# Patient Record
Sex: Male | Born: 1985 | Race: White | Hispanic: No | Marital: Married | Smoking: Former smoker
Health system: Southern US, Community
[De-identification: ages and names within clinical notes are randomized; demographics above are authoritative.]

## PROBLEM LIST (undated history)

## (undated) DIAGNOSIS — K5090 Crohn's disease, unspecified, without complications: Secondary | ICD-10-CM

## (undated) DIAGNOSIS — Z8619 Personal history of other infectious and parasitic diseases: Secondary | ICD-10-CM

## (undated) DIAGNOSIS — T7840XA Allergy, unspecified, initial encounter: Secondary | ICD-10-CM

## (undated) HISTORY — DX: Crohn's disease, unspecified, without complications: K50.90

## (undated) HISTORY — DX: Allergy, unspecified, initial encounter: T78.40XA

## (undated) HISTORY — DX: Personal history of other infectious and parasitic diseases: Z86.19

---

## 2016-02-03 HISTORY — PX: BALLOON SINUPLASTY: SHX5740

## 2017-07-08 ENCOUNTER — Telehealth: Payer: Self-pay | Admitting: Gastroenterology

## 2017-07-08 ENCOUNTER — Other Ambulatory Visit: Payer: Self-pay

## 2017-07-08 DIAGNOSIS — K200 Eosinophilic esophagitis: Secondary | ICD-10-CM

## 2017-07-09 NOTE — Telephone Encounter (Signed)
Spoke with Christy at [facility] and they need a letter of continuation for his Remicade.  I sent a request to Dr Gupta.   

## 2017-07-12 ENCOUNTER — Telehealth: Payer: Self-pay

## 2017-07-12 DIAGNOSIS — K5090 Crohn's disease, unspecified, without complications: Secondary | ICD-10-CM

## 2017-07-12 NOTE — Telephone Encounter (Signed)
Spoke with Neysa Bonito at [facility] and they need a letter of continuation for his Remicade.  I sent a request to Dr Chales Abrahams.

## 2017-07-12 NOTE — Telephone Encounter (Signed)
Thanks Same dose 10mg /kg q 8 weeks

## 2017-07-13 ENCOUNTER — Telehealth: Payer: Self-pay

## 2017-07-13 MED ORDER — INFLIXIMAB 100 MG IV SOLR: INTRAVENOUS | 6 refills | Status: DC

## 2017-07-13 MED ORDER — INFLIXIMAB 100 MG IV SOLR: INTRAVENOUS | 6 refills | Status: AC

## 2017-07-13 NOTE — Telephone Encounter (Signed)
Prescription for continuation of Remicade faxed to [facility].

## 2017-07-13 NOTE — Addendum Note (Signed)
Addended by: Arvella Merles on: 07/13/2017 11:44 AM   Modules accepted: Orders

## 2017-07-13 NOTE — Addendum Note (Signed)
Addended by: Annett Fabian on: 07/13/2017 11:48 AM   Modules accepted: Orders

## 2017-07-13 NOTE — Addendum Note (Signed)
Addended by: Arvella Merles on: 07/13/2017 11:45 AM   Modules accepted: Orders

## 2017-10-13 ENCOUNTER — Telehealth: Payer: Self-pay | Admitting: Gastroenterology

## 2017-10-13 NOTE — Telephone Encounter (Signed)
Looks like he will need to be seen fairly quickly Any opening in App clinic? Or my clinic? Pl order CBC, CMP, sed rate and CRP Stool studies - GI pathogen including C. Diff, WBC, fecal calprotectin PRIOR.  So, we can have results at visit. May need CT (if any abdominal tenderness) and/or steroids - to be determined at clinic visit

## 2017-10-13 NOTE — Telephone Encounter (Signed)
Pt states he thinks he is having a crohn's flare. Reports he has been having diarrhea stools 5-6 times a day. This started 3 days ago. Reports this morning he felt like he had a temp and now he is aching. He is on Remicade. Pt wants to know what to do. Please advise.

## 2017-10-13 NOTE — Telephone Encounter (Signed)
Pt is having chrons flare and wants to know what he can take. Pls call him.

## 2017-10-14 ENCOUNTER — Other Ambulatory Visit: Payer: Self-pay

## 2017-10-14 ENCOUNTER — Encounter: Payer: Self-pay | Admitting: Gastroenterology

## 2017-10-14 ENCOUNTER — Other Ambulatory Visit (INDEPENDENT_AMBULATORY_CARE_PROVIDER_SITE_OTHER): Payer: PRIVATE HEALTH INSURANCE

## 2017-10-14 DIAGNOSIS — K5010 Crohn's disease of large intestine without complications: Secondary | ICD-10-CM

## 2017-10-14 DIAGNOSIS — K5090 Crohn's disease, unspecified, without complications: Secondary | ICD-10-CM

## 2017-10-14 NOTE — Telephone Encounter (Signed)
Pt to come for labs today, orders in epic. Pt scheduled to see Dr. Chales Abrahams 10/15/17@8 :45am. Pt aware of appt. Dr. Chales Abrahams aware.

## 2017-10-15 ENCOUNTER — Other Ambulatory Visit (INDEPENDENT_AMBULATORY_CARE_PROVIDER_SITE_OTHER): Payer: PRIVATE HEALTH INSURANCE

## 2017-10-15 ENCOUNTER — Ambulatory Visit (INDEPENDENT_AMBULATORY_CARE_PROVIDER_SITE_OTHER): Payer: PRIVATE HEALTH INSURANCE | Admitting: Gastroenterology

## 2017-10-15 ENCOUNTER — Encounter: Payer: Self-pay | Admitting: Gastroenterology

## 2017-10-15 VITALS — BP 124/84 | HR 88 | Ht 66.0 in | Wt 169.0 lb

## 2017-10-15 DIAGNOSIS — K5010 Crohn's disease of large intestine without complications: Secondary | ICD-10-CM | POA: Diagnosis not present

## 2017-10-15 DIAGNOSIS — R1031 Right lower quadrant pain: Secondary | ICD-10-CM

## 2017-10-15 DIAGNOSIS — K5090 Crohn's disease, unspecified, without complications: Secondary | ICD-10-CM | POA: Diagnosis not present

## 2017-10-15 LAB — CBC WITH DIFFERENTIAL/PLATELET
Basophils Absolute: 0.1 10*3/uL (ref 0.0–0.1)
Basophils Relative: 0.9 % (ref 0.0–3.0)
EOS ABS: 0.1 10*3/uL (ref 0.0–0.7)
EOS PCT: 1.3 % (ref 0.0–5.0)
HCT: 40.5 % (ref 39.0–52.0)
Hemoglobin: 13.8 g/dL (ref 13.0–17.0)
LYMPHS ABS: 1.3 10*3/uL (ref 0.7–4.0)
Lymphocytes Relative: 20 % (ref 12.0–46.0)
MCHC: 34.2 g/dL (ref 30.0–36.0)
MCV: 86.1 fl (ref 78.0–100.0)
MONO ABS: 1 10*3/uL (ref 0.1–1.0)
Monocytes Relative: 15.2 % — ABNORMAL HIGH (ref 3.0–12.0)
NEUTROS PCT: 62.6 % (ref 43.0–77.0)
Neutro Abs: 4.2 10*3/uL (ref 1.4–7.7)
Platelets: 200 10*3/uL (ref 150.0–400.0)
RBC: 4.7 Mil/uL (ref 4.22–5.81)
RDW: 12.8 % (ref 11.5–15.5)
WBC: 6.7 10*3/uL (ref 4.0–10.5)

## 2017-10-15 LAB — COMPREHENSIVE METABOLIC PANEL
ALT: 18 U/L (ref 0–53)
AST: 19 U/L (ref 0–37)
Albumin: 4.1 g/dL (ref 3.5–5.2)
Alkaline Phosphatase: 79 U/L (ref 39–117)
BUN: 12 mg/dL (ref 6–23)
CHLORIDE: 101 meq/L (ref 96–112)
CO2: 27 mEq/L (ref 19–32)
Calcium: 8.9 mg/dL (ref 8.4–10.5)
Creatinine, Ser: 0.89 mg/dL (ref 0.40–1.50)
GFR: 106.28 mL/min (ref >60.00)
GLUCOSE: 102 mg/dL — AB (ref 70–99)
Potassium: 4 mEq/L (ref 3.5–5.1)
Sodium: 138 mEq/L (ref 135–145)
Total Bilirubin: 0.4 mg/dL (ref 0.2–1.2)
Total Protein: 7.3 g/dL (ref 6.0–8.3)

## 2017-10-15 LAB — SEDIMENTATION RATE: Sed Rate: 69 mm/hr — ABNORMAL HIGH (ref 0–15)

## 2017-10-15 LAB — HIGH SENSITIVITY CRP: CRP, High Sensitivity: 87.71 mg/L — ABNORMAL HIGH (ref 0.000–5.000)

## 2017-10-15 MED ORDER — PREDNISONE 10 MG PO TABS: ORAL_TABLET | ORAL | 0 refills | Status: DC

## 2017-10-15 NOTE — Patient Instructions (Signed)
If you are age 30 or older, your body mass index should be between 23-30. Your Body mass index is 27.28 kg/m. If this is out of the aforementioned range listed, please consider follow up with your Primary Care Provider.  If you are age 48 or younger, your body mass index should be between 19-25. Your Body mass index is 27.28 kg/m. If this is out of the aformentioned range listed, please consider follow up with your Primary Care Provider.   We have sent the following medications to your pharmacy for you to pick up at your convenience: We have sent the following medications to your pharmacy for you to pick up at your convenience: Prednisone 80m tablet 490mby mouth x 1 week. 3067my mouth x 1 week. 21m47m mouth x 2 weeks. 10mg16mmouth x 2 weeks.  You [facility] 8[facility]. [place] arrive 15 minutes prior to your appointment time for registration. Please follow the written instructions below on the day of your exam:  WARNING: IF YOU ARE ALLERGIC TO IODINE/X-RAY DYE, PLEASE NOTIFY RADIOLOGY IMMEDIATELY AT [phone] WILL BE GIVEN A 13 HOUR PREMEDICATION PREP.  1) Do not eat or drink anything after 4am (4 hours prior to your test) 2) You have been given 2 bottles of oral contrast to drink. The solution may taste better if refrigerated, but do NOT add ice or any other liquid to this solution. Shake well before drinking.    Drink 1 bottle of contrast @ 6am (2 hours prior to your exam)  Drink 1 bottle of contrast @ 7am (1 hour prior to your exam)  You may take any medications as prescribed with a small amount of water except for the following: Metformin, Glucophage, Glucovance, Avandamet, Riomet, Fortamet, Actoplus Met, Janumet, Glumetza or Metaglip. The above medications must be held the day of the exam AND 48 hours after the exam.  The purpose of you drinking the oral contrast is  to aid in the visualization of your intestinal tract. The contrast solution may cause some diarrhea. Before your exam is started, you will be given a small amount of fluid to drink. Depending on your individual set of symptoms, you may also receive an intravenous injection of x-ray contrast/dye. [facility] minutes or longer, depending on the type of exam you are having performed.  This test typically takes 30-45 minutes to complete.  If you have any questions regarding your exam or if you need to reschedule, you may call the CT department at [phone] the hours of 8:00 am and 5:00 pm, Monday-Friday.  ________________________________________________________________________   You wDennis Bast be due for a recall colonoscopy in 02/2018. We will send you a reminder in the mail when it gets closer to that time.  Thank you,  Dr. RajesJackquline Denmark

## 2017-10-15 NOTE — Progress Notes (Signed)
Chief Complaint: Diarrhea  Referring Provider:  No ref. provider found      ASSESSMENT AND PLAN;   #1. Crohns disease: Dx at age 32 (1999)-ileocolonic disease.  Dx on UGI series and colonoscopy 5/99.  Treated with steroids, Asacol and Imuran until 2002 when switched to Remicade 10 mg/kg, has been on maintenance Remicade ever since. Seen by Dr Steele Sizer at [facility]. 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  Normal colon. Had exacerbation 2018, treated with short course of steroids with good results.   Plan: - CTE scan of abdomen and pelvis with p.o. and IV contrast. - Start prednisone 40 mg p.o. once a day for 1 week, 30 mg p.o. once a day for 1 week, followed by 20 mg p.o. once a day for 2 weeks, then 10 mg p.o. once a day for 2 weeks and then stop. I have extensively discussed the side effects of prednisone including weight gain, diabetes, osteoporosis, cataracts, glaucoma and psychiatric manifestations. - Follow labs and stool tests done yesterday and this morning. - May need antibiotics depending upon the above results. - Last dose of Remicade was August 27 2017, next dose due October 28, 2017.  We will make decision depending upon the above results. - Recommend repeating colonoscopy Jan 2020. (Patient going to [place] with his family in December).   #2.  Infliximab long-term use (neg HbsAg, TB gold, normal TPMT 12/2015) #3.  Health maintenance -Should get flu shot in November -Increase calcium intake -Should get bone density scan. Should have PCP. -Had shingles in the past.  #4.  Associated lactose intolerance.  HPI:    Gabriel Guzman is a 32 y.o. male  Seen as an emergency work in [place] due to diarrhea, mild abdominal pain-we have ordered CBC, CMP, C-reactive protein and stool testing which are all pending at the present time. X 4-5 days Diarrhea 5-6/day, some nocturnal symptoms Mild abdominal discomfort but no definite pain. No recent antibiotics Has  subjective arthralgias but no arthritis No skin rash Last remicade7/26,  Due 9/27 No significant fever or chills. No nausea vomiting melena or hematochezia.   Past Medical History:  Diagnosis Date  . Crohn's disease (HCC)   . History of shingles     Past Surgical History:  Procedure Laterality Date  . BALLOON SINUPLASTY  2018    Family History  Problem Relation Age of Onset  . Ulcerative colitis Mother   . Colon cancer Neg Hx   . Esophageal cancer Neg Hx   . Prostate cancer Neg Hx   . Breast cancer Neg Hx     Social History  Has 29-year-old and 65-year-old(daughter) -going to [facility] December 2019 Tobacco Use  . Smoking status: Former Smoker    Types: Cigarettes  . Smokeless tobacco: Former Neurosurgeon  . Tobacco comment: quit years ago  Substance Use Topics  . Alcohol use: Yes    Comment: Ocassionally  . Drug use: Never    Current Outpatient Medications  Medication Sig Dispense Refill  . cetirizine (ZYRTEC) 10 MG tablet Take 10 mg by mouth daily.    . Chlorophyll (CHLOROXYGEN PO) Take 1 tablet by mouth daily.    . fluticasone (FLONASE) 50 MCG/ACT nasal spray Place 1 spray into both nostrils as needed for allergies or rhinitis.    Marland Kitchen inFLIXimab (REMICADE) 100 MG injection Please infuse Remicade 10 mg/kg q 8 weeks 1 each 6  . Multiple Vitamin (MULTIVITAMIN) tablet Take 1 tablet by mouth daily.    . Omega-3  Fatty Acids (FISH OIL) 1000 MG CAPS Take 2 capsules by mouth daily.    . Probiotic Product (PROBIOTIC-10 PO) Take 1 tablet by mouth daily.     No current facility-administered medications for this visit.     Not on File  Review of Systems:  Constitutional: Denies fever, chills, diaphoresis, appetite change and fatigue.  HEENT: Denies photophobia, eye pain, redness, hearing loss, ear pain, congestion, sore throat, rhinorrhea, sneezing, mouth sores, neck pain, neck stiffness and tinnitus.   Respiratory: Denies SOB, DOE, cough, chest tightness,  and wheezing.     Cardiovascular: Denies chest pain, palpitations and leg swelling.  Genitourinary: Denies dysuria, urgency, frequency, hematuria, flank pain and difficulty urinating.  Musculoskeletal: Denies myalgias, back pain, joint swelling, arthralgias and gait problem.  Skin: No rash.  Neurological: Denies dizziness, seizures, syncope, weakness, light-headedness, numbness and headaches.  Hematological: Denies adenopathy. Easy bruising, personal or family bleeding history  Psychiatric/Behavioral: No anxiety or depression     Physical Exam:    BP 124/84   Pulse 88   Ht 5\' 6"  (1.676 m)   Wt 169 lb (76.7 kg)   BMI 27.28 kg/m  Filed Weights   10/15/17 0852  Weight: 169 lb (76.7 kg)   Constitutional:  Well-developed, in no acute distress. Psychiatric: Normal mood and affect. Behavior is normal. HEENT: Pupils normal.  Conjunctivae are normal. No scleral icterus. Neck supple.  Cardiovascular: Normal rate, regular rhythm. No edema Pulmonary/chest: Effort normal and breath sounds normal. No wheezing, rales or rhonchi. Abdominal: Soft, nondistended.  Minimally tender but no rebound.. Bowel sounds active throughout. There are no masses palpable. No hepatomegaly. Rectal:  defered Neurological: Alert and oriented to person place and time. Skin: Skin is warm and dry. No rashes noted.   Edman Circle, MD 10/15/2017, 9:05 AM  Cc: No ref. provider found

## 2017-10-19 ENCOUNTER — Telehealth: Payer: Self-pay

## 2017-10-19 LAB — CALPROTECTIN, FECAL: CALPROTECTIN, FECAL: 730 ug/g — AB (ref 0–120)

## 2017-10-19 NOTE — Telephone Encounter (Signed)
Patient had to be rescheduled because [facility]. Patient was scheduled at [facility] for 10/27/17 at 11am, arrive at 10am. Patient notified by phone. NPO 4 hours before test.

## 2017-10-21 LAB — FECAL LACTOFERRIN, QUANT
FECAL LACTOFERRIN: POSITIVE — AB
MICRO NUMBER:: 91100389
SPECIMEN QUALITY:: ADEQUATE

## 2017-10-21 LAB — GASTROINTESTINAL PATHOGEN PANEL PCR
C. difficile Tox A/B, PCR: NOT DETECTED
Campylobacter, PCR: DETECTED — AB
Cryptosporidium, PCR: NOT DETECTED
E coli (ETEC) LT/ST PCR: NOT DETECTED
E coli (STEC) stx1/stx2, PCR: NOT DETECTED
E coli 0157, PCR: NOT DETECTED
GIARDIA LAMBLIA, PCR: NOT DETECTED
NOROVIRUS, PCR: NOT DETECTED
Rotavirus A, PCR: NOT DETECTED
SALMONELLA, PCR: NOT DETECTED
SHIGELLA, PCR: NOT DETECTED

## 2017-10-22 ENCOUNTER — Other Ambulatory Visit (HOSPITAL_BASED_OUTPATIENT_CLINIC_OR_DEPARTMENT_OTHER): Payer: PRIVATE HEALTH INSURANCE

## 2017-10-27 ENCOUNTER — Telehealth: Payer: Self-pay | Admitting: Gastroenterology

## 2017-10-27 ENCOUNTER — Ambulatory Visit (HOSPITAL_COMMUNITY)
Admission: RE | Admit: 2017-10-27 | Discharge: 2017-10-27 | Disposition: A | Discharge status: Home / Self Care | Payer: 59 | Source: Ambulatory Visit | Attending: Gastroenterology | Admitting: Gastroenterology

## 2017-10-27 DIAGNOSIS — K5010 Crohn's disease of large intestine without complications: Secondary | ICD-10-CM | POA: Diagnosis not present

## 2017-10-27 DIAGNOSIS — R1031 Right lower quadrant pain: Secondary | ICD-10-CM | POA: Diagnosis present

## 2017-10-27 MED ORDER — BARIUM SULFATE 0.1 % PO SUSP
ORAL | Status: AC
  Filled 2017-10-27: qty 3

## 2017-10-27 MED ORDER — IOHEXOL 300 MG/ML  SOLN
100.0000 mL | Freq: Once | INTRAMUSCULAR | Status: AC | PRN
  Administered 2017-10-27: 100 mL via INTRAVENOUS

## 2017-10-27 NOTE — Telephone Encounter (Signed)
OK with proceed with remicade

## 2017-10-27 NOTE — Telephone Encounter (Signed)
Is pt ok to proceed with remicade while on antibiotic? Please advise.

## 2017-10-27 NOTE — Telephone Encounter (Signed)
Patient returning nurses call about lab results from 9.13.19. Patient also wanting to know if its okay to get remicade infusion while he is taking an antibiotic.

## 2017-10-28 NOTE — Telephone Encounter (Signed)
Pt aware.

## 2017-10-29 ENCOUNTER — Ambulatory Visit: Payer: Self-pay | Admitting: Gastroenterology

## 2017-11-08 ENCOUNTER — Ambulatory Visit: Payer: Self-pay | Admitting: Gastroenterology

## 2017-11-24 ENCOUNTER — Other Ambulatory Visit: Payer: Self-pay | Admitting: Gastroenterology

## 2018-03-25 ENCOUNTER — Other Ambulatory Visit: Payer: Self-pay | Admitting: Gastroenterology

## 2018-03-25 DIAGNOSIS — Z23 Encounter for immunization: Secondary | ICD-10-CM

## 2018-03-25 MED ORDER — OSELTAMIVIR PHOSPHATE 75 MG PO CAPS: 75.0000 mg | ORAL_CAPSULE | Freq: Every day | ORAL | 0 refills | Status: AC

## 2018-03-25 NOTE — Progress Notes (Signed)
Pt called Son had Flu Wants prophylaxis as he is immunocompromised Did not get the vaccine  Plan: - Tamiflu 75mg  po qd x 2 weeks - Should get vaccine as well.

## 2018-10-07 ENCOUNTER — Telehealth: Payer: Self-pay

## 2018-10-07 NOTE — Telephone Encounter (Signed)
BUN 10mg /dL Creatinine 0.91mg /dL

## 2018-10-20 ENCOUNTER — Telehealth: Payer: Self-pay | Admitting: Gastroenterology

## 2018-10-20 NOTE — Telephone Encounter (Signed)
Most current OV and lab work in [place] faxed to [facility] at [phone] per request for patient to receive Remicade infusion

## 2018-11-24 ENCOUNTER — Telehealth: Payer: Self-pay | Admitting: Gastroenterology

## 2018-11-24 NOTE — Telephone Encounter (Signed)
Left message for patient to call back and schedule a 12 week follow up visit with Dr.Gupta to be seen to continue infusions.

## 2018-11-28 NOTE — Telephone Encounter (Signed)
Office visit scheduled for 12/13/2018.

## 2018-12-13 ENCOUNTER — Ambulatory Visit (INDEPENDENT_AMBULATORY_CARE_PROVIDER_SITE_OTHER): Payer: PRIVATE HEALTH INSURANCE | Admitting: Gastroenterology

## 2018-12-13 ENCOUNTER — Other Ambulatory Visit: Payer: Self-pay

## 2018-12-13 ENCOUNTER — Encounter: Payer: Self-pay | Admitting: Gastroenterology

## 2018-12-13 VITALS — BP 118/84 | HR 72 | Temp 98.4°F | Ht 67.0 in | Wt 168.0 lb

## 2018-12-13 DIAGNOSIS — K5010 Crohn's disease of large intestine without complications: Secondary | ICD-10-CM | POA: Diagnosis not present

## 2018-12-13 NOTE — Patient Instructions (Addendum)
If you are age 31 or older, your body mass index should be between 23-30. Your Body mass index is 26.31 kg/m. If this is out of the aforementioned range listed, please consider follow up with your Primary Care Provider.  If you are age 7 or younger, your body mass index should be between 19-25. Your Body mass index is 26.31 kg/m. If this is out of the aformentioned range listed, please consider follow up with your Primary Care Provider.   You have been scheduled for a colonoscopy. Please follow written instructions given to you at your visit today.  Please pick up your prep supplies at the pharmacy within the next 1-3 days. If you use inhalers (even only as needed), please bring them with you on the day of your procedure. Your physician has requested that you go to www.startemmi.com and enter the access code given to you at your visit today. This web site gives a general overview about your procedure. However, you should still follow specific instructions given to you by our office regarding your preparation for the procedure.  Please go to the lab at [facility] ([place] You will need to go to level "B", you do not need an appointment for this. Hours available are 7:30 am - 4:30 pm. Please have your labs drawn before your next infusion.   Due to recent COVID-19 restrictions implemented by [employer] and state authorities and in an effort to keep both patients and staff as safe as possible, [place] requires COVID-19 testing prior to any scheduled endoscopic procedure. The testing center is located at [address] [place], [place] [address] in the [facility] [employer]  suite.  Your appointment has been scheduled for 12/28/18 at 10:45am.   Please bring your insurance cards to this appointment. You will require your COVID screen 2 business days prior to your endoscopic procedure.  You are not required to  quarantine after your screening.  You will only receive a phone call with the results if it is POSITIVE.  If you do not receive a call the day before your procedure you should begin your prep, if ordered, and you should report to the endo center for your procedure at your designated appointment arrival time ( one hour prior to the procedure time). There is no cost to you for the screening on the day of the swab.  [facility] will file with your insurance company for the testing.    You may receive an automated phone call prior to your procedure or have a message in your MyChart that you have an appointment for a BP/15 at the [facility], please disregard this message.  Your testing will be at the [place] [place] location.   If you are leaving [facility] travel [place] on [address], turn left onto [address], turn night onto [address] at the 1st stop light turn right, pass the [employer] on your right and proceed to [place] (white building).    Thank you,  Dr. Jackquline Denmark

## 2018-12-13 NOTE — Progress Notes (Signed)
Chief Complaint: Diarrhea  Referring Provider:  No ref. provider found      ASSESSMENT AND PLAN;   #1. Crohns disease: Dx at age 33 (1999)-ileocolonic disease on UGI series and colon 5/99.  Treated with steroids, Asacol and Imuran until 2002 when switched to Remicade 10 mg/kg, has been on maintenance Remicade ever since. Seen by Dr Steele Sizer at [facility]. 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  Normal colon. Had exacerbation 2018, 2019, treated with short course of steroids with good results. CTE 10/2017- Mildly active disease distal TI. Nl CBC, CMP, TB test 10/2018.  Plan: - Check remicade level and antibodies, sed rate, CRP, B12, HBsAg prior to next infusion. - Continue Remicade 10mg /kg Q8weeks. - Proceed with colonoscopy (miralax) Dec 2020.  I have discussed risks and benefits in detail.   #2.  Infliximab long-term use (neg HbsAg, TB gold, normal TPMT 12/2015) #3.  Health maintenance -Should get flu shot in November -Increase calcium intake -Should get bone density scan. Should have PCP. -Had shingles in the past.   #4.  Associated lactose intolerance.  HPI:    Gabriel Guzman is a 33 y.o. male   For follow-up visit. Doing much better.  No nausea, vomiting, heartburn, regurgitation, odynophagia or dysphagia.  No significant diarrhea or constipation.  There is no melena or hematochezia. No unintentional weight loss.  No abdominal pain.  Had normal CBC, CMP and TB test 10/2018.  CTE: 10/2017 Mild active Crohn's disease involving the distal and terminal ileum. No evidence of fistula, abscess, or other complication  Past Medical History:  Diagnosis Date  . Crohn's disease (HCC)   . History of shingles     Past Surgical History:  Procedure Laterality Date  . BALLOON SINUPLASTY  2018    Family History  Problem Relation Age of Onset  . Ulcerative colitis Mother   . Colon cancer Neg Hx   . Esophageal cancer Neg Hx   . Prostate cancer Neg Hx   . Breast  cancer Neg Hx     Social History  Has 44-year-old and 56-year-old(daughter) -going to [facility] December 2019 Tobacco Use  . Smoking status: Former Smoker    Types: Cigarettes  . Smokeless tobacco: Former January 2020  . Tobacco comment: quit years ago  Substance Use Topics  . Alcohol use: Yes    Comment: Ocassionally  . Drug use: Never    Current Outpatient Medications  Medication Sig Dispense Refill  . cetirizine (ZYRTEC) 10 MG tablet Take 10 mg by mouth daily.    . Chlorophyll (CHLOROXYGEN PO) Take 1 tablet by mouth daily.    . fluticasone (FLONASE) 50 MCG/ACT nasal spray Place 1 spray into both nostrils as needed for allergies or rhinitis.    Neurosurgeon inFLIXimab (REMICADE) 100 MG injection Please infuse Remicade 10 mg/kg q 8 weeks 1 each 6  . Multiple Vitamin (MULTIVITAMIN) tablet Take 1 tablet by mouth daily.    . Omega-3 Fatty Acids (FISH OIL) 1000 MG CAPS Take 2 capsules by mouth daily.    . Probiotic Product (PROBIOTIC-10 PO) Take 1 tablet by mouth daily.     No current facility-administered medications for this visit.     Not on File  Review of Systems:  Constitutional: Denies fever, chills, diaphoresis, appetite change and fatigue.  HEENT: Denies photophobia, eye pain, redness, hearing loss, ear pain, congestion, sore throat, rhinorrhea, sneezing, mouth sores, neck pain, neck stiffness and tinnitus.   Respiratory: Denies SOB, DOE, cough, chest tightness,  and wheezing.  Cardiovascular: Denies chest pain, palpitations and leg swelling.  Genitourinary: Denies dysuria, urgency, frequency, hematuria, flank pain and difficulty urinating.  Musculoskeletal: Denies myalgias, back pain, joint swelling, arthralgias and gait problem.  Skin: No rash.  Neurological: Denies dizziness, seizures, syncope, weakness, light-headedness, numbness and headaches.  Hematological: Denies adenopathy. Easy bruising, personal or family bleeding history  Psychiatric/Behavioral: No anxiety or depression      Physical Exam:    BP 118/84   Pulse 72   Temp 98.4 F (36.9 C)   Ht 5\' 7"  (1.702 m)   Wt 168 lb (76.2 kg)   BMI 26.31 kg/m  Filed Weights   12/13/18 1548  Weight: 168 lb (76.2 kg)   Constitutional:  Well-developed, in no acute distress. Psychiatric: Normal mood and affect. Behavior is normal. HEENT: Pupils normal.  Conjunctivae are normal. No scleral icterus. Neck supple.  Cardiovascular: Normal rate, regular rhythm. No edema Pulmonary/chest: Effort normal and breath sounds normal. No wheezing, rales or rhonchi. Abdominal: Soft, nondistended.  Minimally tender but no rebound.. Bowel sounds active throughout. There are no masses palpable. No hepatomegaly. Rectal:  defered Neurological: Alert and oriented to person place and time. Skin: Skin is warm and dry. No rashes noted.   Carmell Austria, MD 12/13/2018, 3:53 PM  Cc: No ref. provider found

## 2018-12-20 ENCOUNTER — Encounter: Payer: Self-pay | Admitting: Gastroenterology

## 2018-12-28 ENCOUNTER — Ambulatory Visit (INDEPENDENT_AMBULATORY_CARE_PROVIDER_SITE_OTHER): Payer: 59

## 2018-12-28 ENCOUNTER — Other Ambulatory Visit: Payer: Self-pay | Admitting: Gastroenterology

## 2018-12-28 ENCOUNTER — Other Ambulatory Visit (INDEPENDENT_AMBULATORY_CARE_PROVIDER_SITE_OTHER): Payer: PRIVATE HEALTH INSURANCE

## 2018-12-28 DIAGNOSIS — Z1159 Encounter for screening for other viral diseases: Secondary | ICD-10-CM

## 2018-12-28 DIAGNOSIS — K5010 Crohn's disease of large intestine without complications: Secondary | ICD-10-CM | POA: Diagnosis not present

## 2018-12-28 LAB — C-REACTIVE PROTEIN: CRP: <1 mg/dL (ref 0.5–20.0)

## 2018-12-28 LAB — VITAMIN B12: Vitamin B-12: 656 pg/mL (ref 211–911)

## 2018-12-28 LAB — SEDIMENTATION RATE: Sed Rate: 36 mm/hr — ABNORMAL HIGH (ref 0–15)

## 2018-12-30 LAB — SARS CORONAVIRUS 2 (TAT 6-24 HRS): SARS Coronavirus 2: NEGATIVE

## 2018-12-30 LAB — HEPATITIS B SURFACE ANTIGEN: Hepatitis B Surface Ag: NONREACTIVE

## 2019-01-03 ENCOUNTER — Encounter: Payer: Self-pay | Admitting: Gastroenterology

## 2019-01-03 ENCOUNTER — Other Ambulatory Visit: Payer: Self-pay

## 2019-01-03 ENCOUNTER — Ambulatory Visit (AMBULATORY_SURGERY_CENTER): Payer: 59 | Admitting: Gastroenterology

## 2019-01-03 ENCOUNTER — Telehealth: Payer: Self-pay | Admitting: Gastroenterology

## 2019-01-03 VITALS — BP 105/67 | HR 71 | Temp 97.8°F | Resp 16 | Ht 67.0 in | Wt 168.0 lb

## 2019-01-03 DIAGNOSIS — K50119 Crohn's disease of large intestine with unspecified complications: Secondary | ICD-10-CM | POA: Diagnosis not present

## 2019-01-03 DIAGNOSIS — K5000 Crohn's disease of small intestine without complications: Secondary | ICD-10-CM | POA: Diagnosis not present

## 2019-01-03 MED ORDER — SODIUM CHLORIDE 0.9 % IV SOLN: 500.0000 mL | Freq: Once | INTRAVENOUS | Status: AC

## 2019-01-03 NOTE — Progress Notes (Signed)
Report given to PACU, vss 

## 2019-01-03 NOTE — Telephone Encounter (Signed)
Have discussed colonoscopy findings in detail with the patient and his wife Gabriel Guzman.  Plan: -Surgical consultation with Dr. Forestine Na.  I have also sent him a message. -Please send all notes  Thx  RG

## 2019-01-03 NOTE — Op Note (Addendum)
[facility] Patient Name: Gabriel ParentsJason Guzman Procedure Date: 01/03/2019 11:26 AM MRN: [id number] Endoscopist: Lynann Bolognaajesh Lucilia Yanni , MD Age: [age] Referring MD:  Date of Birth: 1985-02-16 Gender: Male Account #: [id number] Procedure:                Colonoscopy Indications:              Crohns disease: Dx at age 33 (1999)-ileocolonic                            disease on UGI series and colon 5/99. Treated with                            steroids, Asacol and Imuran until 2002 when                            switched to Remicade 10 mg/kg, has been on                            maintenance Remicade ever since. Had exacerbation                            2018, 2019, treated with short course of steroids                            with good results. CTE 10/2017- Mildly active                            disease distal TI. Nl CBC, CMP, TB test 10/2018. Medicines:                Monitored Anesthesia Care Procedure:                Pre-Anesthesia Assessment:                           - Prior to the procedure, a History and Physical                            was performed, and patient medications and                            allergies were reviewed. The patient's tolerance of                            previous anesthesia was also reviewed. The risks                            and benefits of the procedure and the sedation                            options and risks were discussed with the patient.                            All questions were answered, and informed consent  was obtained. Prior Anticoagulants: The patient has                            taken no previous anticoagulant or antiplatelet                            agents. ASA Grade Assessment: II - A patient with                            mild systemic disease. After reviewing the risks                            and benefits, the patient was deemed in                            satisfactory condition to undergo  the procedure.                           After obtaining informed consent, the colonoscope                            was passed under direct vision. Throughout the                            procedure, the patient's blood pressure, pulse, and                            oxygen saturations were monitored continuously. The                            Colonoscope was introduced through the anus and                            advanced to the 1 cm into the ileum. The                            colonoscopy was performed without difficulty. The                            patient tolerated the procedure well. The quality                            of the bowel preparation was good. The terminal                            ileum, ileocecal valve, appendiceal orifice, and                            rectum were photographed. Scope In: 11:35:30 AM Scope Out: 11:51:46 AM Scope Withdrawal Time: 0 hours 13 minutes 49 seconds  Total Procedure Duration: 0 hours 16 minutes 16 seconds  Findings:                 The colon (entire examined portion) appeared  normal. Biopsies were taken with a cold forceps for                            histology from right colon and from left colon.                           The terminal ileum was easily intubated. However, a                            tight stricture was noted in the distal terminal                            ileum with a luminal diameter of approximately 8 mm                            (this could not be traversed with pediatric                            colonoscope).multiple ulcers were noted in the                            terminal ileum including a 1 cm inflammatory                            appearing polyp on a short pedicle. Multiple                            biopsies were obtained from throughout the terminal                            ileum including the polyp and sent for histology.                           The exam was  otherwise without abnormality on                            direct and retroflexion views. Healed posterior                            anal fissure was noted. No evidence of perianal                            Crohn's disease. Complications:            No immediate complications. Estimated Blood Loss:     Estimated blood loss: none. Impression:               -Active Crohn's disease involving terminal ileum                            with tight distal TI stricture, inflammatory                            appearing polyp (biopsied) (moderate activity)                           -  No colonic or perianal involvement. Recommendation:           - Patient has a contact number available for                            emergencies. The signs and symptoms of potential                            delayed complications were discussed with the                            patient. Return to normal activities tomorrow.                            Written discharge instructions were provided to the                            patient.                           - Resume previous diet.                           - Continue present medications.                           - Await pathology results.                           - Return to GI clinic in 4 weeks. The above                            findings are despite Remicade 10 mg/kg. I would                            recommend surgical evaluation. Have discussed above                            with the patient and patient's wife Gabriel Guzman. Jackquline Denmark, MD 01/03/2019 12:11:37 PM This report has been signed electronically.

## 2019-01-03 NOTE — Patient Instructions (Signed)
Colon biopsies taken today. Result letter in your mail in 2-3 weeks. Resume current medications.  Return office visit in 4 weeks (Dr.Gupta's nurse will call you with that appointment).   YOU HAD AN ENDOSCOPIC PROCEDURE TODAY AT [facility]:   Refer to the procedure report that was given to you for any specific questions about what was found during the examination.  If the procedure report does not answer your questions, please call your gastroenterologist to clarify.  If you requested that your care partner not be given the details of your procedure findings, then the procedure report has been included in a sealed envelope for you to review at your convenience later.  YOU SHOULD EXPECT: Some feelings of bloating in the abdomen. Passage of more gas than usual.  Walking can help get rid of the air that was put into your GI tract during the procedure and reduce the bloating. If you had a lower endoscopy (such as a colonoscopy or flexible sigmoidoscopy) you may notice spotting of blood in your stool or on the toilet paper. If you underwent a bowel prep for your procedure, you may not have a normal bowel movement for a few days.  Please Note:  You might notice some irritation and congestion in your nose or some drainage.  This is from the oxygen used during your procedure.  There is no need for concern and it should clear up in a day or so.  SYMPTOMS TO REPORT IMMEDIATELY:   Following lower endoscopy (colonoscopy or flexible sigmoidoscopy):  Excessive amounts of blood in the stool  Significant tenderness or worsening of abdominal pains  Swelling of the abdomen that is new, acute  Fever of 100F or higher   For urgent or emergent issues, a gastroenterologist can be reached at any hour by calling [phone].   DIET:  We do recommend a small meal at first, but then you may proceed to your regular diet.  Drink plenty of fluids but you should avoid alcoholic beverages for 24  hours.  ACTIVITY:  You should plan to take it easy for the rest of today and you should NOT DRIVE or use heavy machinery until tomorrow (because of the sedation medicines used during the test).    FOLLOW UP: Our staff will call the number listed on your records 48-72 hours following your procedure to check on you and address any questions or concerns that you may have regarding the information given to you following your procedure. If we do not reach you, we will leave a message.  We will attempt to reach you two times.  During this call, we will ask if you have developed any symptoms of COVID 19. If you develop any symptoms (ie: fever, flu-like symptoms, shortness of breath, cough etc.) before then, please call [phone].  If you test positive for Covid 19 in the 2 weeks post procedure, please call and report this information to [place].    If any biopsies were taken you will be contacted by phone or by letter within the next 1-3 weeks.  Please call us at [phone] if you have not heard about the biopsies in 3 weeks.    SIGNATURES/CONFIDENTIALITY: You and/or your care partner have signed paperwork which will be entered into your electronic medical record.  These signatures attest to the fact that that the information above on your After Visit Summary has been reviewed and is understood.  Full responsibility of the confidentiality of this discharge information lies  with you and/or your care-partner.

## 2019-01-03 NOTE — Progress Notes (Signed)
Temp check by:JB Vital check by:CW  The medical and surgical history was reviewed and verified with the patient. 

## 2019-01-03 NOTE — Telephone Encounter (Signed)
Referral has been faxed to Dr. Forestine Na in [place] per MD request; awaiting response;

## 2019-01-05 NOTE — Telephone Encounter (Signed)
Called and spoke with patient-patient reports he has been scheduled for surgery with Dr. Amalia Hailey on 01/16/2019; patient has been scheduled for a f/u OV with Dr. Lyndel Safe on 01/31/2019 at 3:40 pm; Patient advised to call back to the office at [phone] should questions/concerns arise;  Patient verbalized understanding of information/instructions;

## 2019-01-06 ENCOUNTER — Telehealth: Payer: Self-pay

## 2019-01-06 NOTE — Telephone Encounter (Signed)
  Follow up Call-  Call back number 01/03/2019  Post procedure Call Back phone  # [phone]  Permission to leave phone message Yes     Patient questions:  Do you have a fever, pain , or abdominal swelling? No. Pain Score  0 *  Have you tolerated food without any problems? Yes.    Have you been able to return to your normal activities? Yes.    Do you have any questions about your discharge instructions: Diet   No. Medications  No. Follow up visit  No.  Do you have questions or concerns about your Care? No.  Actions: * If pain score is 4 or above: No action needed, pain <4.  1. Have you developed a fever since your procedure? no  2.   Have you had an respiratory symptoms (SOB or cough) since your procedure? no  3.   Have you tested positive for COVID 19 since your procedure no  4.   Have you had any family members/close contacts diagnosed with the COVID 19 since your procedure?  no   If yes to any of these questions please route to Joylene John, RN and Alphonsa Gin, Therapist, sports.

## 2019-01-09 ENCOUNTER — Other Ambulatory Visit: Payer: Self-pay

## 2019-01-16 HISTORY — PX: OTHER SURGICAL HISTORY: SHX169

## 2019-01-31 ENCOUNTER — Telehealth (INDEPENDENT_AMBULATORY_CARE_PROVIDER_SITE_OTHER): Payer: 59 | Admitting: Gastroenterology

## 2019-01-31 ENCOUNTER — Other Ambulatory Visit: Payer: Self-pay

## 2019-01-31 ENCOUNTER — Encounter: Payer: Self-pay | Admitting: Gastroenterology

## 2019-01-31 VITALS — Ht 67.0 in | Wt 150.0 lb

## 2019-01-31 DIAGNOSIS — K50119 Crohn's disease of large intestine with unspecified complications: Secondary | ICD-10-CM

## 2019-01-31 NOTE — Patient Instructions (Signed)
If you are age 31 or older, your body mass index should be between 23-30. Your Body mass index is 23.49 kg/m. If this is out of the aforementioned range listed, please consider follow up with your Primary Care Provider.  If you are age 40 or younger, your body mass index should be between 19-25. Your Body mass index is 23.49 kg/m. If this is out of the aformentioned range listed, please consider follow up with your Primary Care Provider.   Please go to the lab at [facility] ([address] [facility] You will need to go to level "B", you do not need an appointment for this. Hours available are 7:30 am - 4:30 pm. Please have your labs drawn after your third infusion of Remicade.   You will be due for a recall colonoscopy in 02/2020. We will send you a reminder in the mail when it gets closer to that time.  Follow up in 6 months   Thank you,  Dr. Jackquline Denmark

## 2019-01-31 NOTE — Progress Notes (Signed)
Chief Complaint: Diarrhea  Referring Provider:  Kalman Jewels, NP      ASSESSMENT AND PLAN;   #1. Crohns disease: Dx at age 33 (1999)-ileocolonic disease,S/P robotic ileocecectomy 01/16/2019  Plan:  -Please obtain path report from Dr. Logan Bores office/[facility].  Cannot see it in Care-everywhere. -Restart Remicade mid Jan 2021 to prevent postoperative recurrence.  Dose 10 mg/kg every 8 weeks.  He has already rescheduled the date. -Check remicade level and antibodies, sed rate, CRP, CBC, CMP and B12 after third infusion. -FU in 6 months.  Earlier, if with any problems. -Consider repeat colonoscopy in 1 year to r/o recurrence.    HPI:    Gabriel Guzman is a 33 y.o. male   For follow-up visit. S/P robotic ileocecectomy 01/16/2019 by Dr. Marcelino Freestone  Doing much better. Had diarrhea but getting better every day. No pain. Eating better. No nausea, vomiting, heartburn, regurgitation, odynophagia or dysphagia. There is no melena or hematochezia. No unintentional weight loss.  Past Medical History:  Diagnosis Date  . Crohn's disease (HCC)   . History of shingles     Past Surgical History:  Procedure Laterality Date  . BALLOON SINUPLASTY  2018    Family History  Problem Relation Age of Onset  . Ulcerative colitis Mother   . Colon cancer Neg Hx   . Esophageal cancer Neg Hx   . Prostate cancer Neg Hx   . Breast cancer Neg Hx   . Rectal cancer Neg Hx   . Stomach cancer Neg Hx     Social History  Has 36-year-old and 66-year-old(daughter) -going to [employer] [facility] December 2019 Tobacco Use  . Smoking status: Former Smoker    Types: Cigarettes  . Smokeless tobacco: Former Neurosurgeon  . Tobacco comment: quit years ago  Substance Use Topics  . Alcohol use: Yes    Comment: Ocassionally  . Drug use: Never    Current Outpatient Medications  Medication Sig Dispense Refill  . cetirizine (ZYRTEC) 10 MG tablet Take 10 mg by mouth daily.    . Chlorophyll (CHLOROXYGEN PO) Take 1 tablet by  mouth daily.    . fluticasone (FLONASE) 50 MCG/ACT nasal spray Place 1 spray into both nostrils as needed for allergies or rhinitis.    Marland Kitchen inFLIXimab (REMICADE) 100 MG injection Please infuse Remicade 10 mg/kg q 8 weeks 1 each 6  . Multiple Vitamin (MULTIVITAMIN) tablet Take 1 tablet by mouth daily.    . Omega-3 Fatty Acids (FISH OIL) 1000 MG CAPS Take 2 capsules by mouth daily.    . Probiotic Product (PROBIOTIC-10 PO) Take 1 tablet by mouth daily.     Current Facility-Administered Medications  Medication Dose Route Frequency Provider Last Rate Last Admin  . 0.9 %  sodium chloride infusion  500 mL Intravenous Once Lynann Bologna, MD        No Known Allergies  Review of Systems:  neg     Physical Exam:    Ht 5\' 7"  (1.702 m)   Wt 150 lb (68 kg)   BMI 23.49 kg/m  Filed Weights   01/31/19 0820  Weight: 150 lb (68 kg)  Televisit   I connected with  02/02/19 on 01/31/19 by a video enabled telemedicine application and verified that I am speaking with the correct person using two identifiers.   I discussed the limitations of evaluation and management by telemedicine. The patient expressed understanding and agreed to proceed.  Time spent: 15 minutes.     02/02/19, MD  01/31/2019, 3:12 PM  Cc: Allred, Marthann Schiller, NP

## 2019-04-19 ENCOUNTER — Encounter: Payer: Self-pay | Admitting: Gastroenterology

## 2020-03-23 IMAGING — CT CT ENTEROGRAPHY (ABD-PELV W/ CM)
2 of 5 series · 17 of 46 positions shown, 19 images · IV contrast (OMNIPAQUE)
Comparison: None.

CLINICAL DATA: Right lower quadrant pain.  Crohn's disease.

EXAM:
CT ABDOMEN AND PELVIS WITH CONTRAST (ENTEROGRAPHY)
TECHNIQUE: Multidetector CT of the abdomen and pelvis during bolus
administration of intravenous contrast. Negative oral contrast was
given.
CONTRAST:  100mL OMNIPAQUE IOHEXOL 300 MG/ML  SOLN

[Series 3: entero thins · axial · 0.77mm/px · z∈[+1079,+1497]mm · 14 of 237 slices shown, 16 images]
[im 14/237  soft-tissue]
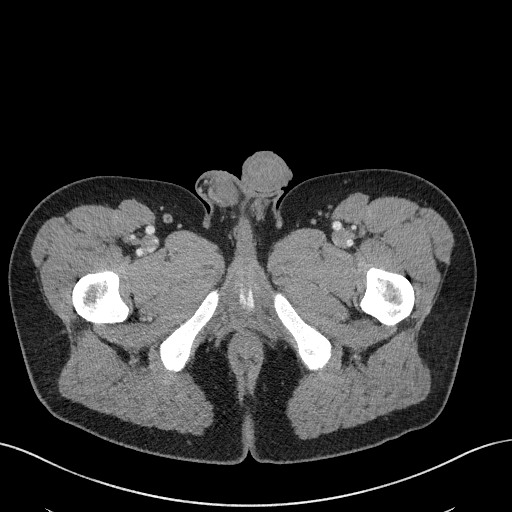
[im 14/237  bone]
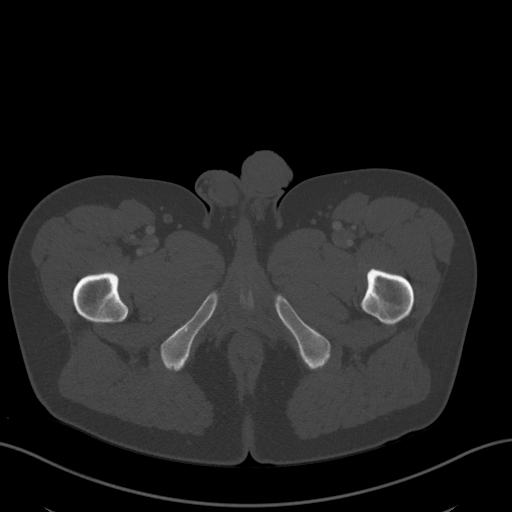
[im 27/237  soft-tissue]
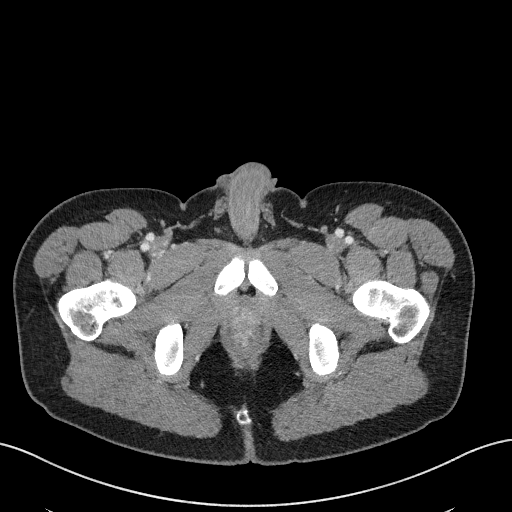
[im 53/237  soft-tissue]
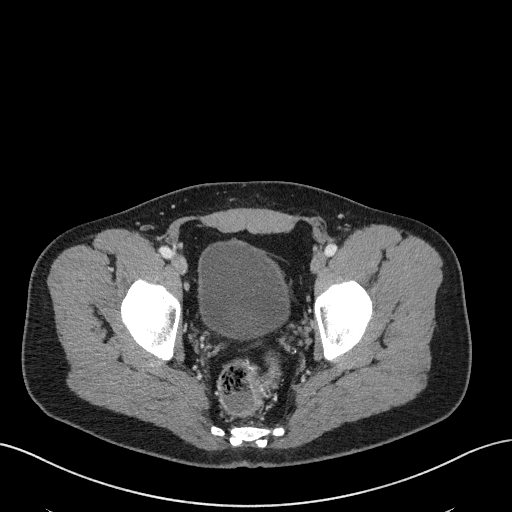
[im 66/237  soft-tissue]
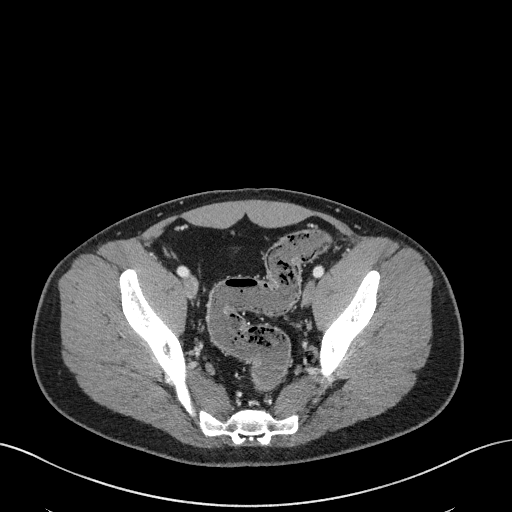
[im 79/237  soft-tissue]
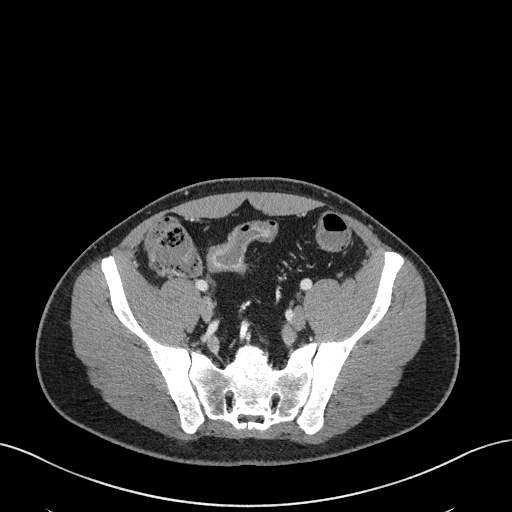
[im 92/237  soft-tissue]
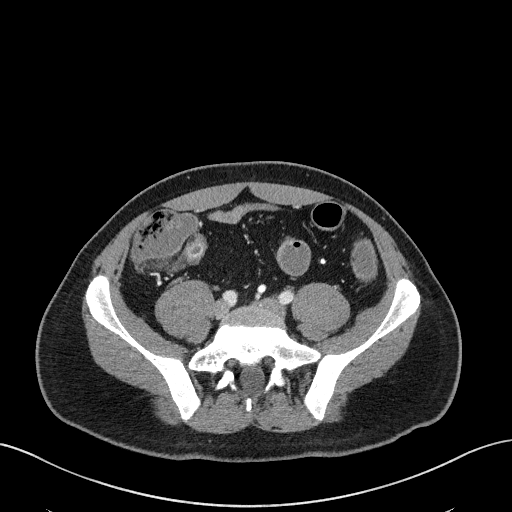
[im 105/237  soft-tissue]
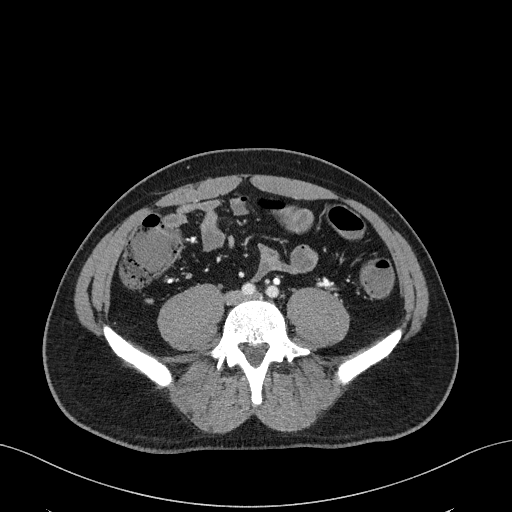
[im 132/237  soft-tissue]
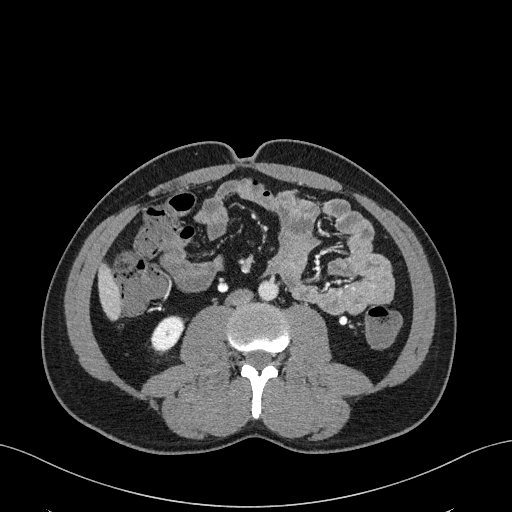
[im 145/237  soft-tissue]
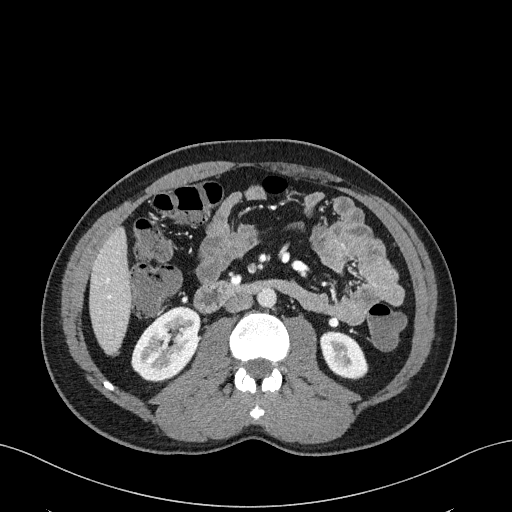
[im 145/237  bone]
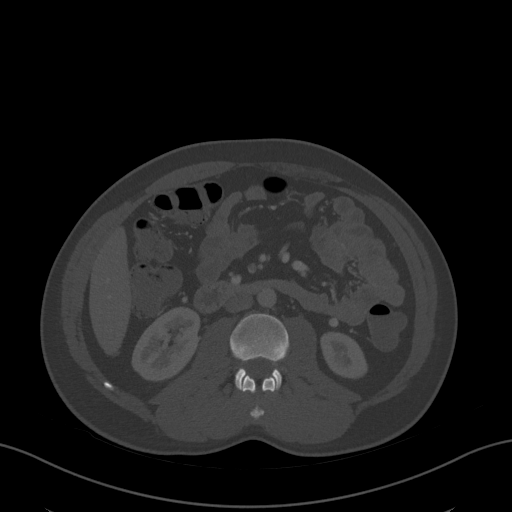
[im 158/237  soft-tissue]
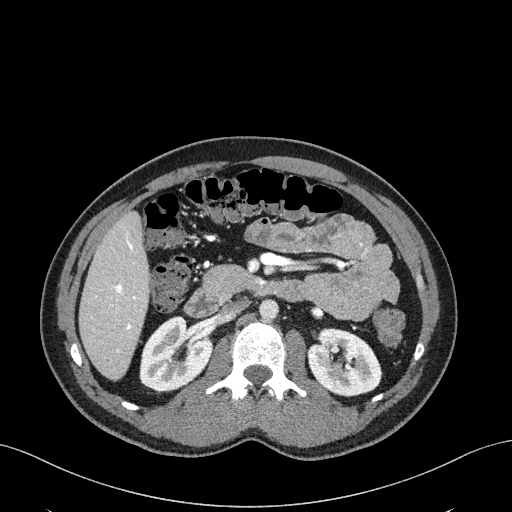
[im 171/237  soft-tissue]
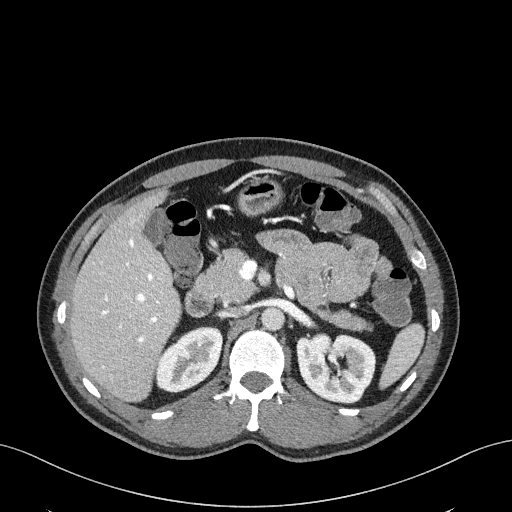
[im 184/237  soft-tissue]
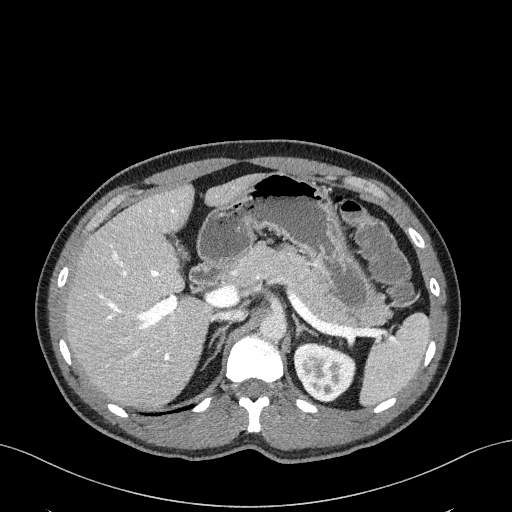
[im 210/237  soft-tissue]
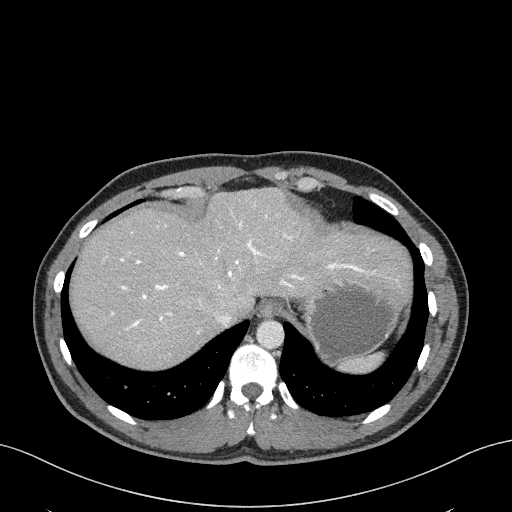
[im 223/237  soft-tissue]
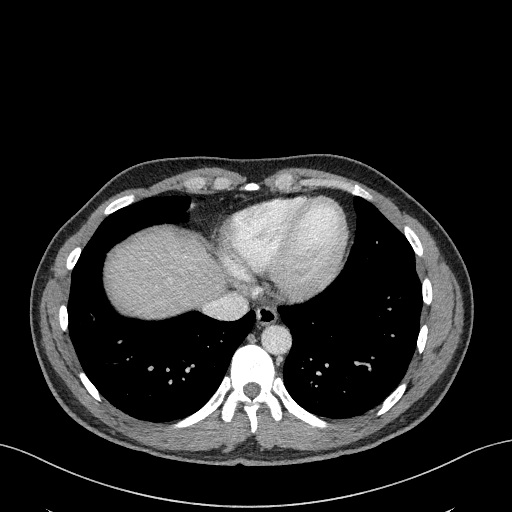

[Series 6: coronal · coronal · 0.86mm/px · 3 of 78 slices shown]
[im 26/78  soft-tissue]
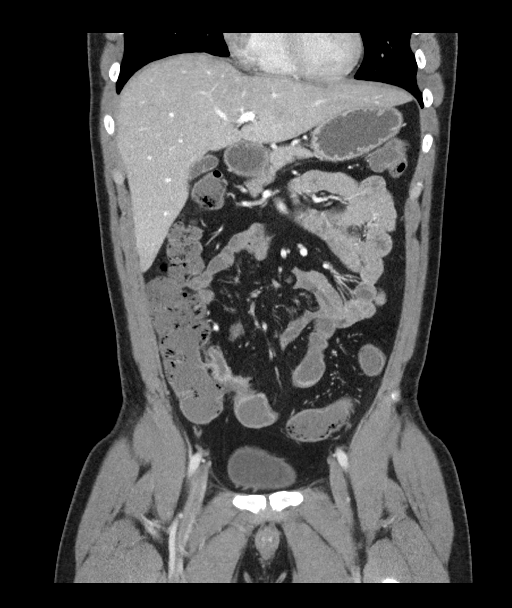
[im 35/78  soft-tissue]
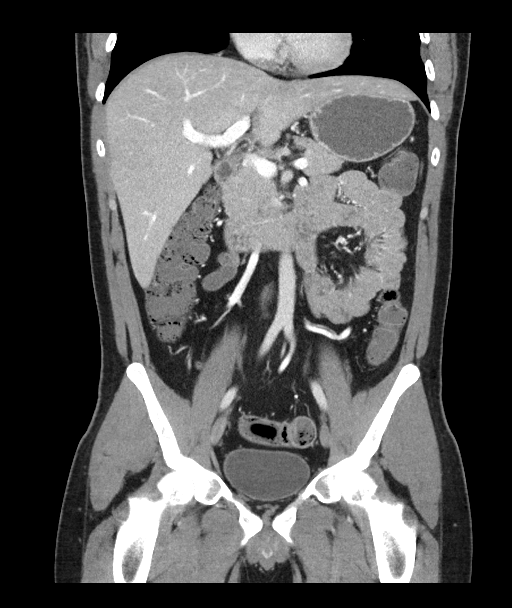
[im 43/78  soft-tissue]
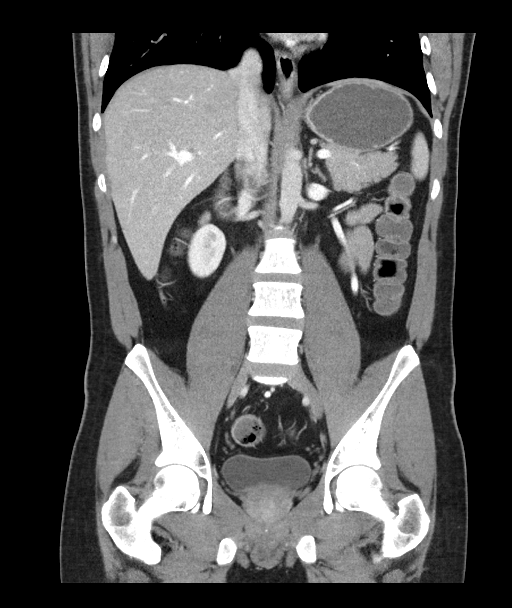

[17 of 46 positions shown; findings below may reference images not displayed]

FINDINGS: Lower Chest: No acute findings.

Hepatobiliary: No hepatic masses identified. Gallbladder is
unremarkable.

Pancreas:  No mass or inflammatory changes.

Spleen: Within normal limits in size and appearance.

Adrenals/Urinary Tract: No masses identified. No evidence of
hydronephrosis.

Stomach/Bowel: Mild wall thickening and mucosal enhancement is seen
involving the distal and terminal ileum in the right lower quadrant,
consistent with active Crohn's disease. No evidence fistula,
abscess, or bowel obstruction. Normal appendix visualized.

Vascular/Lymphatic: No pathologically enlarged lymph nodes. No
abdominal aortic aneurysm.

Reproductive:  No mass or other significant abnormality.

Other:  None.

Musculoskeletal:  No suspicious bone lesions identified.
IMPRESSION: Mild active Crohn's disease involving the distal and terminal ileum.
No evidence of fistula, abscess, or other complication.

## 2020-07-30 ENCOUNTER — Encounter: Payer: Self-pay | Admitting: Gastroenterology

## 2020-07-30 ENCOUNTER — Ambulatory Visit (INDEPENDENT_AMBULATORY_CARE_PROVIDER_SITE_OTHER): Payer: 59 | Admitting: Gastroenterology

## 2020-07-30 ENCOUNTER — Other Ambulatory Visit: Payer: Self-pay

## 2020-07-30 VITALS — BP 122/72 | HR 79 | Ht 67.0 in | Wt 163.5 lb

## 2020-07-30 DIAGNOSIS — K50919 Crohn's disease, unspecified, with unspecified complications: Secondary | ICD-10-CM

## 2020-07-30 NOTE — Patient Instructions (Signed)
If you are age 33 or older, your body mass index should be between 23-30. Your Body mass index is 25.61 kg/m. If this is out of the aforementioned range listed, please consider follow up with your Primary Care Provider.  If you are age 36 or younger, your body mass index should be between 19-25. Your Body mass index is 25.61 kg/m. If this is out of the aformentioned range listed, please consider follow up with your Primary Care Provider.   __________________________________________________________  The [facility] providers would like to encourage you to use [facility] to communicate with providers for non-urgent requests or questions.  Due to long hold times on the telephone, sending your provider a message by [facility] may be a faster and more efficient way to get a response.  Please allow 48 business hours for a response.  Please remember that this is for non-urgent requests.   Please call in September regarding a November colonoscopy date. Our schedule is usually 2 months in advance but we will put you in for a recall date as of 12/03/2020  Please get CRP and Vitamin b12 to be checked at next lab draw.  Please call us in 3 week if you haven't heard anything from [place] regarding your Remicade. A nurse will be handling your case.  Follow up in 6 months by calling pour office as time gets closer.  Thank you,  Dr. Lynann Bologna

## 2020-07-30 NOTE — Progress Notes (Addendum)
    Chief Complaint: Diarrhea ° °Referring Provider:  Allred, Jolena B, NP    ° ° °ASSESSMENT AND PLAN;  ° °#1. Crohns disease: Dx at age 35 (1999)-ileocolonic disease, S/P robotic ileocecectomy 01/16/2019, now in remission ° °Plan: ° °-Remicade 10 mg/kg every 8 weeks to continue. °-CRP, B12 next bld draw °-Colon with miralax Nov 2022 (he wants to wait until Nov) °-FU in 6 months.  ° °Discussed long-term side effects of TNFs ° °HPI:   ° °Gabriel Guzman is a 35 y.o. male  ° °For follow-up visit. ° °Doing much better.  No problems.  Has rare diarrhea especially when he eats salads or pizzas.  No nocturnal symptoms.  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  No melena or hematochezia. ° °S/P robotic ileocecectomy 01/16/2019 by Dr. Rich Evans ° °No weight loss. ° °No joint pains, eye problems or skin problems. ° °Past Medical History:  °Diagnosis Date  ° Crohn's disease (HCC)   ° History of shingles   ° ° °Past Surgical History:  °Procedure Laterality Date  ° BALLOON SINUPLASTY  2018  ° ° °Family History  °Problem Relation Age of Onset  ° Ulcerative colitis Mother   ° Colon cancer Neg Hx   ° Esophageal cancer Neg Hx   ° Prostate cancer Neg Hx   ° Breast cancer Neg Hx   ° Rectal cancer Neg Hx   ° Stomach cancer Neg Hx   ° ° °Social History  °Has 2-year-old and 5-year-old(daughter) -going to [employer] December 2019 °Tobacco Use  ° Smoking status: Former Smoker  °  Types: Cigarettes  ° Smokeless tobacco: Former User  ° Tobacco comment: quit years ago  °Substance Use Topics  ° Alcohol use: Yes  °  Comment: Ocassionally  ° Drug use: Never  ° ° °Current Outpatient Medications  °Medication Sig Dispense Refill  ° cetirizine (ZYRTEC) 10 MG tablet Take 10 mg by mouth daily.    ° Chlorophyll (CHLOROXYGEN PO) Take 1 tablet by mouth daily.    ° fluticasone (FLONASE) 50 MCG/ACT nasal spray Place 1 spray into both nostrils as needed for allergies or rhinitis.    ° inFLIXimab (REMICADE) 100 MG injection Please infuse Remicade 10 mg/kg q 8 weeks 1  each 6  ° Multiple Vitamin (MULTIVITAMIN) tablet Take 1 tablet by mouth daily.    ° Omega-3 Fatty Acids (FISH OIL) 1000 MG CAPS Take 2 capsules by mouth daily.    ° Probiotic Product (PROBIOTIC-10 PO) Take 1 tablet by mouth daily.    ° °Current Facility-Administered Medications  °Medication Dose Route Frequency Provider Last Rate Last Admin  ° 0.9 %  sodium chloride infusion  500 mL Intravenous Once Jaevon Paras, MD      ° ° °No Known Allergies ° °Review of Systems:  °neg ° °  ° °Physical Exam:   ° °Ht 5' 7" (1.702 m)    Wt 150 lb (68 kg)    BMI 23.49 kg/m²  °Filed Weights  ° 01/31/19 0820  °Weight: 150 lb (68 kg)  °Gen: awake, alert, NAD °HEENT: anicteric, no pallor °CV: RRR, no mrg °Pulm: CTA b/l °Abd: soft, NT/ND, +BS throughout °Ext: no c/c/e °Neuro: nonfocal ° ° ° ° ° °Raj Debroah Shuttleworth, MD 01/31/2019, 3:12 PM ° °Cc: Allred, Jolena B, NP ° ° °

## 2020-08-07 ENCOUNTER — Telehealth: Payer: Self-pay

## 2020-08-07 NOTE — Telephone Encounter (Signed)
Rogue Jury from [facility] requested last office visit on this patient(6-29-) They were faxed to [phone] successfully. Phone number [phone]

## 2020-11-21 ENCOUNTER — Encounter: Payer: Self-pay | Admitting: Gastroenterology

## 2021-01-08 ENCOUNTER — Other Ambulatory Visit: Payer: Self-pay

## 2021-01-08 ENCOUNTER — Ambulatory Visit (AMBULATORY_SURGERY_CENTER): Payer: Self-pay

## 2021-01-08 VITALS — Ht 66.0 in | Wt 160.0 lb

## 2021-01-08 DIAGNOSIS — K50919 Crohn's disease, unspecified, with unspecified complications: Secondary | ICD-10-CM

## 2021-01-08 NOTE — Progress Notes (Signed)
Denies allergies to eggs or soy products. Denies complication of anesthesia or sedation. Denies use of weight loss medication. Denies use of O2.   Emmi instructions given for colonoscopy.  

## 2021-01-10 ENCOUNTER — Encounter: Payer: Self-pay | Admitting: Gastroenterology

## 2021-01-19 ENCOUNTER — Encounter: Payer: Self-pay | Admitting: Certified Registered Nurse Anesthetist

## 2021-01-20 ENCOUNTER — Encounter: Payer: Self-pay | Admitting: Gastroenterology

## 2021-01-20 ENCOUNTER — Other Ambulatory Visit: Payer: Self-pay | Admitting: *Deleted

## 2021-01-20 ENCOUNTER — Ambulatory Visit (AMBULATORY_SURGERY_CENTER): Payer: 59 | Admitting: Gastroenterology

## 2021-01-20 VITALS — BP 111/83 | HR 84 | Temp 98.6°F | Resp 29 | Ht 66.0 in

## 2021-01-20 DIAGNOSIS — K640 First degree hemorrhoids: Secondary | ICD-10-CM | POA: Diagnosis not present

## 2021-01-20 DIAGNOSIS — K50919 Crohn's disease, unspecified, with unspecified complications: Secondary | ICD-10-CM | POA: Diagnosis present

## 2021-01-20 DIAGNOSIS — K5000 Crohn's disease of small intestine without complications: Secondary | ICD-10-CM | POA: Diagnosis not present

## 2021-01-20 MED ORDER — SODIUM CHLORIDE 0.9 % IV SOLN: 500.0000 mL | Freq: Once | INTRAVENOUS | Status: DC

## 2021-01-20 MED ORDER — HYDROCORTISONE (PERIANAL) 2.5 % EX CREA: 1.0000 "application " | TOPICAL_CREAM | Freq: Two times a day (BID) | CUTANEOUS | 2 refills | Status: DC

## 2021-01-20 NOTE — Progress Notes (Signed)
° ° °  Chief Complaint: Diarrhea  Referring Provider:  Kalman Jewels, NP      ASSESSMENT AND PLAN;   #1. Crohns disease: Dx at age 35 (1999)-ileocolonic disease, S/P robotic ileocecectomy 01/16/2019, now in remission  Plan:  -Remicade 10 mg/kg every 8 weeks to continue. -CRP, B12 next bld draw -Colon with miralax Nov 2022 (he wants to wait until Nov) -FU in 6 months.   Discussed long-term side effects of TNFs  HPI:    Gabriel Guzman is a 35 y.o. male   For follow-up visit.  Doing much better.  No problems.  Has rare diarrhea especially when he eats salads or pizzas.  No nocturnal symptoms.  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  No melena or hematochezia.  S/P robotic ileocecectomy 01/16/2019 by Dr. Marcelino Freestone  No weight loss.  No joint pains, eye problems or skin problems.  Past Medical History:  Diagnosis Date   Crohn's disease (HCC)    History of shingles     Past Surgical History:  Procedure Laterality Date   BALLOON SINUPLASTY  2018    Family History  Problem Relation Age of Onset   Ulcerative colitis Mother    Colon cancer Neg Hx    Esophageal cancer Neg Hx    Prostate cancer Neg Hx    Breast cancer Neg Hx    Rectal cancer Neg Hx    Stomach cancer Neg Hx     Social History  Has 31-year-old and 44-year-old(daughter) -going to [facility] December 2019 Tobacco Use   Smoking status: Former Smoker    Types: Cigarettes   Smokeless tobacco: Former Neurosurgeon   Tobacco comment: quit years ago  Substance Use Topics   Alcohol use: Yes    Comment: Ocassionally   Drug use: Never    Current Outpatient Medications  Medication Sig Dispense Refill   cetirizine (ZYRTEC) 10 MG tablet Take 10 mg by mouth daily.     Chlorophyll (CHLOROXYGEN PO) Take 1 tablet by mouth daily.     fluticasone (FLONASE) 50 MCG/ACT nasal spray Place 1 spray into both nostrils as needed for allergies or rhinitis.     inFLIXimab (REMICADE) 100 MG injection Please infuse Remicade 10 mg/kg q 8 weeks 1  each 6   Multiple Vitamin (MULTIVITAMIN) tablet Take 1 tablet by mouth daily.     Omega-3 Fatty Acids (FISH OIL) 1000 MG CAPS Take 2 capsules by mouth daily.     Probiotic Product (PROBIOTIC-10 PO) Take 1 tablet by mouth daily.     Current Facility-Administered Medications  Medication Dose Route Frequency Provider Last Rate Last Admin   0.9 %  sodium chloride infusion  500 mL Intravenous Once Lynann Bologna, MD        No Known Allergies  Review of Systems:  neg     Physical Exam:    Ht 5\' 7"  (1.702 m)    Wt 150 lb (68 kg)    BMI 23.49 kg/m  Filed Weights   01/31/19 0820  Weight: 150 lb (68 kg)  Gen: awake, alert, NAD HEENT: anicteric, no pallor CV: RRR, no mrg Pulm: CTA b/l Abd: soft, NT/ND, +BS throughout Ext: no c/c/e Neuro: nonfocal      02/02/19, MD 01/31/2019, 3:12 PM  Cc: Allred, 02/02/2019, NP

## 2021-01-20 NOTE — Progress Notes (Signed)
[place] vital signs. 

## 2021-01-20 NOTE — Progress Notes (Signed)
Report given to PACU, vss 

## 2021-01-20 NOTE — Op Note (Signed)
[place] Patient Name: Gabriel Guzman Procedure Date: 01/20/2021 2:26 PM MRN: [id number] Endoscopist: Jackquline Denmark , MD Age: 35 Referring MD:  Date of Birth: 03/01/1985 Gender: Male Account #: [id number] Procedure:                Colonoscopy Indications:              Crohns disease: Dx at age 32 (1999)-ileocolonic                            disease, S/P robotic ileocecectomy 01/2019 Medicines:                Monitored Anesthesia Care Procedure:                Pre-Anesthesia Assessment:                           - Prior to the procedure, a History and Physical                            was performed, and patient medications and                            allergies were reviewed. The patient's tolerance of                            previous anesthesia was also reviewed. The risks                            and benefits of the procedure and the sedation                            options and risks were discussed with the patient.                            All questions were answered, and informed consent                            was obtained. Prior Anticoagulants: The patient has                            taken no previous anticoagulant or antiplatelet                            agents. ASA Grade Assessment: II - A patient with                            mild systemic disease. After reviewing the risks                            and benefits, the patient was deemed in                            satisfactory condition to undergo the procedure.  After obtaining informed consent, the colonoscope                            was passed under direct vision. Throughout the                            procedure, the patient's blood pressure, pulse, and                            oxygen saturations were monitored continuously. The                            Olympus PCF-H190DL (#[id number]) Colonoscope was                            introduced through the  anus and advanced to the the                            ileocolonic anastomosis. The colonoscopy was                            performed without difficulty. The patient tolerated                            the procedure well. The quality of the bowel                            preparation was good. The Neo- terminal ileum,                            neo-cecum, and rectum were photographed. Scope In: 2:41:22 PM Scope Out: 2:53:54 PM Scope Withdrawal Time: 0 hours 9 minutes 53 seconds  Total Procedure Duration: 0 hours 12 minutes 32 seconds  Findings:                 There was evidence of a prior end-to-side                            ileo-colonic anastomosis in the cecum. This was                            patent and was characterized by 3-4 superficial                            erosions at the anastomosis. The neo-TI was normal.                            4 to 5 cm was intubated. Biopsies were taken with a                            cold forceps for histology.                           The colon (entire examined portion) appeared  normal. Not biopsied since previous biopsies were                            negative.                           Non-bleeding internal hemorrhoids were found during                            retroflexion. The hemorrhoids were small and Grade                            I (internal hemorrhoids that do not prolapse). Also                            noted was 1 cm posterior anal fissure with a small                            sentinel pile. No perianal Crohn's disease.                           The exam was otherwise without abnormality on                            direct and retroflexion views. Complications:            No immediate complications. Estimated Blood Loss:     Estimated blood loss: none. Impression:               - Patent end-to-side ileo-colonic anastomosis,                            characterized by few superficial  erosions. Biopsied.                           - The entire examined colon is normal.                           - Non-bleeding internal hemorrhoids.                           - The examination was otherwise normal on direct                            and retroflexion views. Recommendation:           - Patient has a contact number available for                            emergencies. The signs and symptoms of potential                            delayed complications were discussed with the                            patient. Return to normal activities tomorrow.  Written discharge instructions were provided to the                            patient.                           - Resume previous diet.                           - Check Remicade trough level, antibodies, CRP and                            B12. He had normal CBC, CMP recently.                           - Continue present medications.                           - Await pathology results.                           - Repeat colonoscopy for surveillance based on                            pathology results.                           - The findings and recommendations were discussed                            with the patient's family. Lynann Bologna, MD 01/20/2021 3:04:14 PM This report has been signed electronically.

## 2021-01-20 NOTE — Progress Notes (Signed)
Pt's states no medical or surgical changes since previsit or office visit. 

## 2021-01-20 NOTE — Patient Instructions (Addendum)
Please come for labs the day before your Remicade dose is due.   Use Benefiber 1 teaspoon once daily.   Use hydrocortisone cream twice daily for 10 days then as needed.   YOU HAD AN ENDOSCOPIC PROCEDURE TODAY AT THE [facility]:   Refer to the procedure report that was given to you for any specific questions about what was found during the examination.  If the procedure report does not answer your questions, please call your gastroenterologist to clarify.  If you requested that your care partner not be given the details of your procedure findings, then the procedure report has been included in a sealed envelope for you to review at your convenience later.  YOU SHOULD EXPECT: Some feelings of bloating in the abdomen. Passage of more gas than usual.  Walking can help get rid of the air that was put into your GI tract during the procedure and reduce the bloating. If you had a lower endoscopy (such as a colonoscopy or flexible sigmoidoscopy) you may notice spotting of blood in your stool or on the toilet paper. If you underwent a bowel prep for your procedure, you may not have a normal bowel movement for a few days.  Please Note:  You might notice some irritation and congestion in your nose or some drainage.  This is from the oxygen used during your procedure.  There is no need for concern and it should clear up in a day or so.  SYMPTOMS TO REPORT IMMEDIATELY:  Following lower endoscopy (colonoscopy or flexible sigmoidoscopy):  Excessive amounts of blood in the stool  Significant tenderness or worsening of abdominal pains  Swelling of the abdomen that is new, acute  Fever of 100F or higher  For urgent or emergent issues, a gastroenterologist can be reached at any hour by calling [phone]. Do not use MyChart messaging for urgent concerns.    DIET:  We do recommend a small meal at first, but then you may proceed to your regular diet.  Drink plenty of fluids but you should avoid  alcoholic beverages for 24 hours.  ACTIVITY:  You should plan to take it easy for the rest of today and you should NOT DRIVE or use heavy machinery until tomorrow (because of the sedation medicines used during the test).    FOLLOW UP: Our staff will call the number listed on your records 48-72 hours following your procedure to check on you and address any questions or concerns that you may have regarding the information given to you following your procedure. If we do not reach you, we will leave a message.  We will attempt to reach you two times.  During this call, we will ask if you have developed any symptoms of COVID 19. If you develop any symptoms (ie: fever, flu-like symptoms, shortness of breath, cough etc.) before then, please call [phone].  If you test positive for Covid 19 in the 2 weeks post procedure, please call and report this information to [place].    If any biopsies were taken you will be contacted by phone or by letter within the next 1-3 weeks.  Please call us at [phone] if you have not heard about the biopsies in 3 weeks.    SIGNATURES/CONFIDENTIALITY: You and/or your care partner have signed paperwork which will be entered into your electronic medical record.  These signatures attest to the fact that that the information above on your After Visit Summary has been reviewed and is understood.  Full responsibility of the confidentiality of this discharge information lies with you and/or your care-partner.

## 2021-01-20 NOTE — Progress Notes (Signed)
Called to room to assist during endoscopic procedure.  Patient ID and intended procedure confirmed with present staff. Received instructions for my participation in the procedure from the performing physician.  

## 2021-01-22 ENCOUNTER — Telehealth: Payer: Self-pay

## 2021-01-22 NOTE — Telephone Encounter (Signed)
°  Follow up Call-  Call back number 01/20/2021 01/03/2019  Post procedure Call Back phone  # [phone]  Permission to leave phone message Yes Yes  Some recent data might be hidden    F/u call, no answer,left msg

## 2021-01-23 ENCOUNTER — Encounter: Payer: Self-pay | Admitting: Gastroenterology

## 2021-01-24 ENCOUNTER — Other Ambulatory Visit: Payer: Self-pay

## 2021-01-24 DIAGNOSIS — K50919 Crohn's disease, unspecified, with unspecified complications: Secondary | ICD-10-CM

## 2021-02-26 ENCOUNTER — Other Ambulatory Visit (INDEPENDENT_AMBULATORY_CARE_PROVIDER_SITE_OTHER): Payer: BC Managed Care – PPO

## 2021-02-26 DIAGNOSIS — K50919 Crohn's disease, unspecified, with unspecified complications: Secondary | ICD-10-CM | POA: Diagnosis not present

## 2021-02-26 LAB — VITAMIN B12: Vitamin B-12: 446 pg/mL (ref 211–911)

## 2021-02-26 LAB — HIGH SENSITIVITY CRP: CRP, High Sensitivity: 1.3 mg/L (ref 0.000–5.000)

## 2021-03-05 LAB — THIOPURINE METHYLTRANSFERASE (TPMT), RBC: Thiopurine Methyltransferase, RBC: 13 nmol/hr/mL RBC

## 2021-03-06 LAB — INFLIXIMAB+AB (SERIAL MONITOR)
Anti-Infliximab Antibody: <22 ng/mL
Infliximab Drug Level: 14 ug/mL

## 2021-03-24 ENCOUNTER — Ambulatory Visit: Payer: 59 | Admitting: Internal Medicine

## 2021-04-01 ENCOUNTER — Other Ambulatory Visit: Payer: Self-pay

## 2021-04-01 ENCOUNTER — Ambulatory Visit (INDEPENDENT_AMBULATORY_CARE_PROVIDER_SITE_OTHER): Payer: BC Managed Care – PPO | Admitting: Gastroenterology

## 2021-04-01 ENCOUNTER — Encounter: Payer: Self-pay | Admitting: Gastroenterology

## 2021-04-01 VITALS — BP 120/78 | HR 77 | Ht 66.0 in | Wt 159.0 lb

## 2021-04-01 DIAGNOSIS — K50919 Crohn's disease, unspecified, with unspecified complications: Secondary | ICD-10-CM

## 2021-04-01 NOTE — Patient Instructions (Addendum)
If you are age 34 or older, your body mass index should be between 23-30. Your Body mass index is 25.66 kg/m. If this is out of the aforementioned range listed, please consider follow up with your Primary Care Provider.  If you are age 55 or younger, your body mass index should be between 19-25. Your Body mass index is 25.66 kg/m. If this is out of the aformentioned range listed, please consider follow up with your Primary Care Provider.   ________________________________________________________  The [facility] providers would like to encourage you to use [facility] to communicate with providers for non-urgent requests or questions.  Due to long hold times on the telephone, sending your provider a message by [facility] may be a faster and more efficient way to get a response.  Please allow 48 business hours for a response.  Please remember that this is for non-urgent requests.  _______________________________________________________  Follow up in 1 year please by calling to schedule an appointment. Continue current medications   Thank you,  Dr. Lynann Bologna

## 2021-04-01 NOTE — Progress Notes (Signed)
° ° °  Chief Complaint: Diarrhea  Referring Provider:  Clayborne Artist, NP      ASSESSMENT AND PLAN;   #1. Crohns disease: Dx at age 36 (1999)-ileocolonic disease, S/P robotic ileocecectomy 01/16/2019, now in remission  Plan:  -Remicade 10 mg/kg every 8 weeks to continue. -FU in 1 year.  Discussed long-term side effects of TNFs  HPI:    Gabriel Guzman is a 36 y.o. male   For follow-up visit.  Good ADA level, negative ADA antibodies Normal C-reactive protein and labs.  Doing much better.  No problems.  Has rare diarrhea especially when he eats salads or pizzas.  No nocturnal symptoms.  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  No melena or hematochezia.  Pleased with the progress.  S/P robotic ileocecectomy 01/16/2019 by Dr. Forestine Na  No weight loss.  No joint pains, eye problems or skin problems.   Previous GI work-up: Last Colon 01/2021 - Patent end-to-side ileo-colonic anastomosis, characterized by few superficial erosions. Bx-ileitis. - The entire examined colon is normal. - Non-bleeding internal hemorrhoids.  Past Medical History:  Diagnosis Date   Crohn's disease ([place])    History of shingles     Past Surgical History:  Procedure Laterality Date   BALLOON SINUPLASTY  2018    Family History  Problem Relation Age of Onset   Ulcerative colitis Mother    Colon cancer Neg Hx    Esophageal cancer Neg Hx    Prostate cancer Neg Hx    Breast cancer Neg Hx    Rectal cancer Neg Hx    Stomach cancer Neg Hx     Social History  Has 38-year-old and 56-year-old(daughter) -going to [facility] December 2019 Tobacco Use   Smoking status: Former Smoker    Types: Cigarettes   Smokeless tobacco: Former Systems developer   Tobacco comment: quit years ago  Substance Use Topics   Alcohol use: Yes    Comment: Ocassionally   Drug use: Never    Current Outpatient Medications  Medication Sig Dispense Refill   cetirizine (ZYRTEC) 10 MG tablet Take 10 mg by mouth daily.     Chlorophyll  (CHLOROXYGEN PO) Take 1 tablet by mouth daily.     fluticasone (FLONASE) 50 MCG/ACT nasal spray Place 1 spray into both nostrils as needed for allergies or rhinitis.     inFLIXimab (REMICADE) 100 MG injection Please infuse Remicade 10 mg/kg q 8 weeks 1 each 6   Multiple Vitamin (MULTIVITAMIN) tablet Take 1 tablet by mouth daily.     Omega-3 Fatty Acids (FISH OIL) 1000 MG CAPS Take 2 capsules by mouth daily.     Probiotic Product (PROBIOTIC-10 PO) Take 1 tablet by mouth daily.     Current Facility-Administered Medications  Medication Dose Route Frequency Provider Last Rate Last Admin   0.9 %  sodium chloride infusion  500 mL Intravenous Once Jackquline Denmark, MD        No Known Allergies  Review of Systems:  neg     Physical Exam:    Ht 5\' 7"  (1.702 m)    Wt 150 lb (68 kg)    BMI 23.49 kg/m  Filed Weights   01/31/19 0820  Weight: 150 lb (68 kg)  Gen: awake, alert, NAD HEENT: anicteric, no pallor CV: RRR, no mrg Pulm: CTA b/l Abd: soft, NT/ND, +BS throughout Ext: no c/c/e Neuro: nonfocal      Carmell Austria, MD 01/31/2019, 3:12 PM  Cc: Allred, Marthann Schiller, NP

## 2022-11-12 LAB — LAB REPORT - SCANNED: EGFR: 86

## 2023-03-03 ENCOUNTER — Other Ambulatory Visit: Payer: Self-pay

## 2023-03-03 ENCOUNTER — Telehealth: Payer: Self-pay | Admitting: Gastroenterology

## 2023-03-03 DIAGNOSIS — K50919 Crohn's disease, unspecified, with unspecified complications: Secondary | ICD-10-CM

## 2023-03-03 NOTE — Telephone Encounter (Signed)
Please continue Remicade 10 mg/kg every 8 weeks as before Check CBC, CMP, CRP, Remicade antibodies and trough level just before the infusion FU GI in about 3-6 months. APP clinic or mine  RG

## 2023-03-03 NOTE — Telephone Encounter (Signed)
PT needs to have Remicade ordered for his next infusion on 03/11/2023.

## 2023-03-03 NOTE — Telephone Encounter (Signed)
Orders for labs placed in Epic.  Left message for pt to call back.  Order NOT faxed yet to [facility]. Awaiting fax number

## 2023-03-03 NOTE — Telephone Encounter (Signed)
Patient f/u on previous note. Please advise.,   Thank you

## 2023-03-03 NOTE — Telephone Encounter (Signed)
Pt stated that he received a call from [facility] this AM stating that he needs another order and prescription for the Remicade to be infused. Pt next infusion date is 03/11/2023. Pt previously scheduled for an office visit on  05/05/2023 at 8:30 to see Dr. Chales Abrahams. Last office visit was 04/01/2021  Please review and advise on order and prescription for Remicade.

## 2023-03-05 NOTE — Telephone Encounter (Signed)
Orders faxed to [facility].  Orders for labs previously placed. Left message for pt to call back

## 2023-03-05 NOTE — Telephone Encounter (Signed)
PT returned call  Please advise

## 2023-03-05 NOTE — Telephone Encounter (Signed)
Pt was made aware of Dr. Chales Abrahams recommendations: Fax was sent to [facility] with orders for Remicade Orders for labs placed in Epic. Pt made aware. Pt stated that he will come next week prior to his infusion to have labs drawn. Location to lab provided.  Pt previously scheduled for an office visit on 05/05/2023 at 8:30 to see Dr. Chales Abrahams. Pt reminded Pt verbalized understanding with all questions answered.

## 2023-03-09 ENCOUNTER — Other Ambulatory Visit (INDEPENDENT_AMBULATORY_CARE_PROVIDER_SITE_OTHER): Payer: BC Managed Care – PPO

## 2023-03-09 DIAGNOSIS — K50919 Crohn's disease, unspecified, with unspecified complications: Secondary | ICD-10-CM | POA: Diagnosis not present

## 2023-03-09 LAB — COMPREHENSIVE METABOLIC PANEL
ALT: 19 U/L (ref 0–53)
AST: 20 U/L (ref 0–37)
Albumin: 4.3 g/dL (ref 3.5–5.2)
Alkaline Phosphatase: 79 U/L (ref 39–117)
BUN: 10 mg/dL (ref 6–23)
CO2: 26 meq/L (ref 19–32)
Calcium: 8.9 mg/dL (ref 8.4–10.5)
Chloride: 103 meq/L (ref 96–112)
Creatinine, Ser: 0.9 mg/dL (ref 0.40–1.50)
GFR: 110.31 mL/min (ref >60.00)
Glucose, Bld: 91 mg/dL (ref 70–99)
Potassium: 4.6 meq/L (ref 3.5–5.1)
Sodium: 136 meq/L (ref 135–145)
Total Bilirubin: 0.5 mg/dL (ref 0.2–1.2)
Total Protein: 8.2 g/dL (ref 6.0–8.3)

## 2023-03-09 LAB — CBC WITH DIFFERENTIAL/PLATELET
Basophils Absolute: 0.1 10*3/uL (ref 0.0–0.1)
Basophils Relative: 0.9 % (ref 0.0–3.0)
Eosinophils Absolute: 0 10*3/uL (ref 0.0–0.7)
Eosinophils Relative: 0.7 % (ref 0.0–5.0)
HCT: 43 % (ref 39.0–52.0)
Hemoglobin: 14.3 g/dL (ref 13.0–17.0)
Lymphocytes Relative: 23.8 % (ref 12.0–46.0)
Lymphs Abs: 1.6 10*3/uL (ref 0.7–4.0)
MCHC: 33.4 g/dL (ref 30.0–36.0)
MCV: 88.8 fL (ref 78.0–100.0)
Monocytes Absolute: 0.6 10*3/uL (ref 0.1–1.0)
Monocytes Relative: 8.8 % (ref 3.0–12.0)
Neutro Abs: 4.5 10*3/uL (ref 1.4–7.7)
Neutrophils Relative %: 65.8 % (ref 43.0–77.0)
Platelets: 264 10*3/uL (ref 150.0–400.0)
RBC: 4.84 Mil/uL (ref 4.22–5.81)
RDW: 13.2 % (ref 11.5–15.5)
WBC: 6.9 10*3/uL (ref 4.0–10.5)

## 2023-03-09 LAB — C-REACTIVE PROTEIN: CRP: <1 mg/dL (ref 0.5–20.0)

## 2023-03-15 LAB — SERIAL MONITORING

## 2023-03-16 LAB — INFLIXIMAB+AB (SERIAL MONITOR)
Anti-Infliximab Antibody: <22 ng/mL
Infliximab Drug Level: 10 ug/mL

## 2023-03-18 LAB — LAB REPORT - SCANNED: EGFR: 111

## 2023-05-05 ENCOUNTER — Encounter: Payer: Self-pay | Admitting: Gastroenterology

## 2023-05-05 ENCOUNTER — Ambulatory Visit: Payer: BC Managed Care – PPO | Admitting: Gastroenterology

## 2023-05-05 VITALS — BP 94/76 | HR 88 | Ht 65.5 in | Wt 164.2 lb

## 2023-05-05 DIAGNOSIS — Z9049 Acquired absence of other specified parts of digestive tract: Secondary | ICD-10-CM | POA: Diagnosis not present

## 2023-05-05 DIAGNOSIS — K5080 Crohn's disease of both small and large intestine without complications: Secondary | ICD-10-CM

## 2023-05-05 DIAGNOSIS — K50919 Crohn's disease, unspecified, with unspecified complications: Secondary | ICD-10-CM

## 2023-05-05 MED ORDER — HYDROCORTISONE (PERIANAL) 2.5 % EX CREA: 1.0000 | TOPICAL_CREAM | Freq: Two times a day (BID) | CUTANEOUS | 4 refills | Status: AC

## 2023-05-05 NOTE — Progress Notes (Signed)
 Chief Complaint: FU  Referring Provider:  Kalman Jewels, NP      ASSESSMENT AND PLAN;   #1. Crohns disease: Dx at age 38 (1999)-ileocolonic disease, S/P robotic ileocecectomy 01/16/2019, now in remission  Plan:  -Remicade 10 mg/kg every 8 weeks to continue. -HC 2.5 % cream BID x 2 weeks, 4RF -Check B12 when next bld draw. -FU in 1 year.  HPI:    Gabriel Guzman is a 38 y.o. male   For follow-up visit.  Discussed the use of AI scribe software for clinical note transcription with the patient, who gave verbal consent to proceed.  History of Present Illness  No complaints except occ diarrhea, particularly after consuming salads, pizzas, and spicy foods. This symptom has been more frequent since ileaocolonic resection.Marland Kitchen He occasionally uses Imodium to manage severe diarrhea and has noticed that hemorrhoids can flare up during these episodes. No significant weight loss has been reported. No nocturnal S/S. No BRBPR.  He is currently on Remicade infusions, which he receives at [facility]. He has an infusion scheduled for tomorrow. His B12 levels were checked two years ago and were normal at 446, with a previous level of 656 five years ago.  In January, he experienced foot pain localized to the area behind his big toe. Blood work and x-rays suggested a minor bunion, and he was referred for orthotics, which he is still awaiting. Blood tests indicated a high sedimentation rate, but C-reactive protein was normal. Tests for rheumatoid arthritis, including rheumatoid factor and HLA-B27, were negative. An ANA test showed a titer of 1:40, which was considered mild. Prednisone alleviated the foot pain overnight. He mentions being flat-footed, a condition shared with his father, who also suffers from gout, though his uric acid levels were normal.   Good ADA level, negative ADA antibodies Normal C-reactive protein and labs 03/2023   S/P robotic ileocecectomy 01/16/2019 by Dr.  Marcelino Freestone  No weight loss.  No joint pains, eye problems or skin problems.  03/09/2023 Infliximab Drug Level 10 14 CM     Anti-Infliximab Antibody <22      Previous GI work-up: Last Colon 01/2021 - Patent end-to-side ileo-colonic anastomosis, characterized by few superficial erosions. Bx-ileitis. - The entire examined colon is normal. - Non-bleeding internal hemorrhoids.  Past Medical History:  Diagnosis Date   Crohn's disease (HCC)    History of shingles     Past Surgical History:  Procedure Laterality Date   BALLOON SINUPLASTY  2018    Family History  Problem Relation Age of Onset   Ulcerative colitis Mother    Colon cancer Neg Hx    Esophageal cancer Neg Hx    Prostate cancer Neg Hx    Breast cancer Neg Hx    Rectal cancer Neg Hx    Stomach cancer Neg Hx     Social History  Has 69-year-old and 22-year-old(daughter) -going to [facility] December 2019 Tobacco Use   Smoking status: Former Smoker    Types: Cigarettes   Smokeless tobacco: Former Neurosurgeon   Tobacco comment: quit years ago  Substance Use Topics   Alcohol use: Yes    Comment: Ocassionally   Drug use: Never    Current Outpatient Medications  Medication Sig Dispense Refill   cetirizine (ZYRTEC) 10 MG tablet Take 10 mg by mouth daily.     Chlorophyll (CHLOROXYGEN PO) Take 1 tablet by mouth daily.     fluticasone (FLONASE) 50 MCG/ACT nasal spray Place 1 spray into both nostrils as  needed for allergies or rhinitis.     inFLIXimab (REMICADE) 100 MG injection Please infuse Remicade 10 mg/kg q 8 weeks 1 each 6   Multiple Vitamin (MULTIVITAMIN) tablet Take 1 tablet by mouth daily.     Omega-3 Fatty Acids (FISH OIL) 1000 MG CAPS Take 2 capsules by mouth daily.     Probiotic Product (PROBIOTIC-10 PO) Take 1 tablet by mouth daily.     Current Facility-Administered Medications  Medication Dose Route Frequency Provider Last Rate Last Admin   0.9 %  sodium chloride infusion  500 mL Intravenous Once Lynann Bologna,  MD        No Known Allergies  Review of Systems:  neg     Physical Exam:    Ht 5\' 7"  (1.702 m)   Wt 150 lb (68 kg)   BMI 23.49 kg/m  Filed Weights   01/31/19 0820  Weight: 150 lb (68 kg)  Gen: awake, alert, NAD HEENT: anicteric, no pallor CV: RRR, no mrg Pulm: CTA b/l Abd: soft, NT/ND, +BS throughout Ext: no c/c/e Neuro: nonfocal      Edman Circle, MD 01/31/2019, 3:12 PM  Cc: Allred, Luvenia Heller, NP

## 2023-05-05 NOTE — Patient Instructions (Addendum)
 _______________________________________________________  If your blood pressure at your visit was 140/90 or greater, please contact your primary care physician to follow up on this.  _______________________________________________________  If you are age 36 or older, your body mass index should be between 23-30. Your Body mass index is 26.92 kg/m. If this is out of the aforementioned range listed, please consider follow up with your Primary Care Provider.  If you are age 69 or younger, your body mass index should be between 19-25. Your Body mass index is 26.92 kg/m. If this is out of the aformentioned range listed, please consider follow up with your Primary Care Provider.   ________________________________________________________  The [facility] providers would like to encourage you to use [facility] to communicate with providers for non-urgent requests or questions.  Due to long hold times on the telephone, sending your provider a message by [facility] may be a faster and more efficient way to get a response.  Please allow 48 business hours for a response.  Please remember that this is for non-urgent requests.  _______________________________________________________  Continue biologic. Hydrocortisone cream has been sent to the pharmacy  Check vitamin b12 at your next blood draw  Please follow up in 12 months. Give [place] a call at [phone] to schedule an appointment.  Thank you,  Dr. Lynann Bologna

## 2023-05-07 LAB — LAB REPORT - SCANNED: EGFR: 93

## 2023-10-25 LAB — LAB REPORT - SCANNED: EGFR: 115
